# Patient Record
Sex: Male | Born: 1935 | Race: Asian | Hispanic: No | Marital: Married | Smoking: Former smoker
Health system: Southern US, Community
[De-identification: ages and names within clinical notes are randomized; demographics above are authoritative.]

## PROBLEM LIST (undated history)

## (undated) DIAGNOSIS — E039 Hypothyroidism, unspecified: Secondary | ICD-10-CM

## (undated) DIAGNOSIS — M1990 Unspecified osteoarthritis, unspecified site: Secondary | ICD-10-CM

## (undated) DIAGNOSIS — K56609 Unspecified intestinal obstruction, unspecified as to partial versus complete obstruction: Secondary | ICD-10-CM

## (undated) DIAGNOSIS — E119 Type 2 diabetes mellitus without complications: Secondary | ICD-10-CM

## (undated) DIAGNOSIS — M419 Scoliosis, unspecified: Secondary | ICD-10-CM

## (undated) DIAGNOSIS — C801 Malignant (primary) neoplasm, unspecified: Secondary | ICD-10-CM

## (undated) HISTORY — PX: FOREIGN BODY REMOVAL: SHX962

## (undated) HISTORY — PX: COLECTOMY: SHX59

## (undated) HISTORY — PX: CATARACT EXTRACTION W/ INTRAOCULAR LENS  IMPLANT, BILATERAL: SHX1307

## (undated) HISTORY — PX: LAPAROSCOPIC INCISIONAL / UMBILICAL / VENTRAL HERNIA REPAIR: SUR789

## (undated) HISTORY — PX: HERNIA REPAIR: SHX51

---

## 2008-10-21 ENCOUNTER — Observation Stay (HOSPITAL_COMMUNITY): Admission: EM | Admit: 2008-10-21 | Discharge: 2008-10-23 | Payer: Self-pay | Admitting: Emergency Medicine

## 2010-06-21 IMAGING — CR DG CHEST 1V PORT
1 series · 1 of 1 positions shown · non-contrast
Comparison: Portable exam 6660 hours without priors for comparison.

CLINICAL DATA: Chest pain

PORTABLE CHEST - 1 VIEW

[AP]
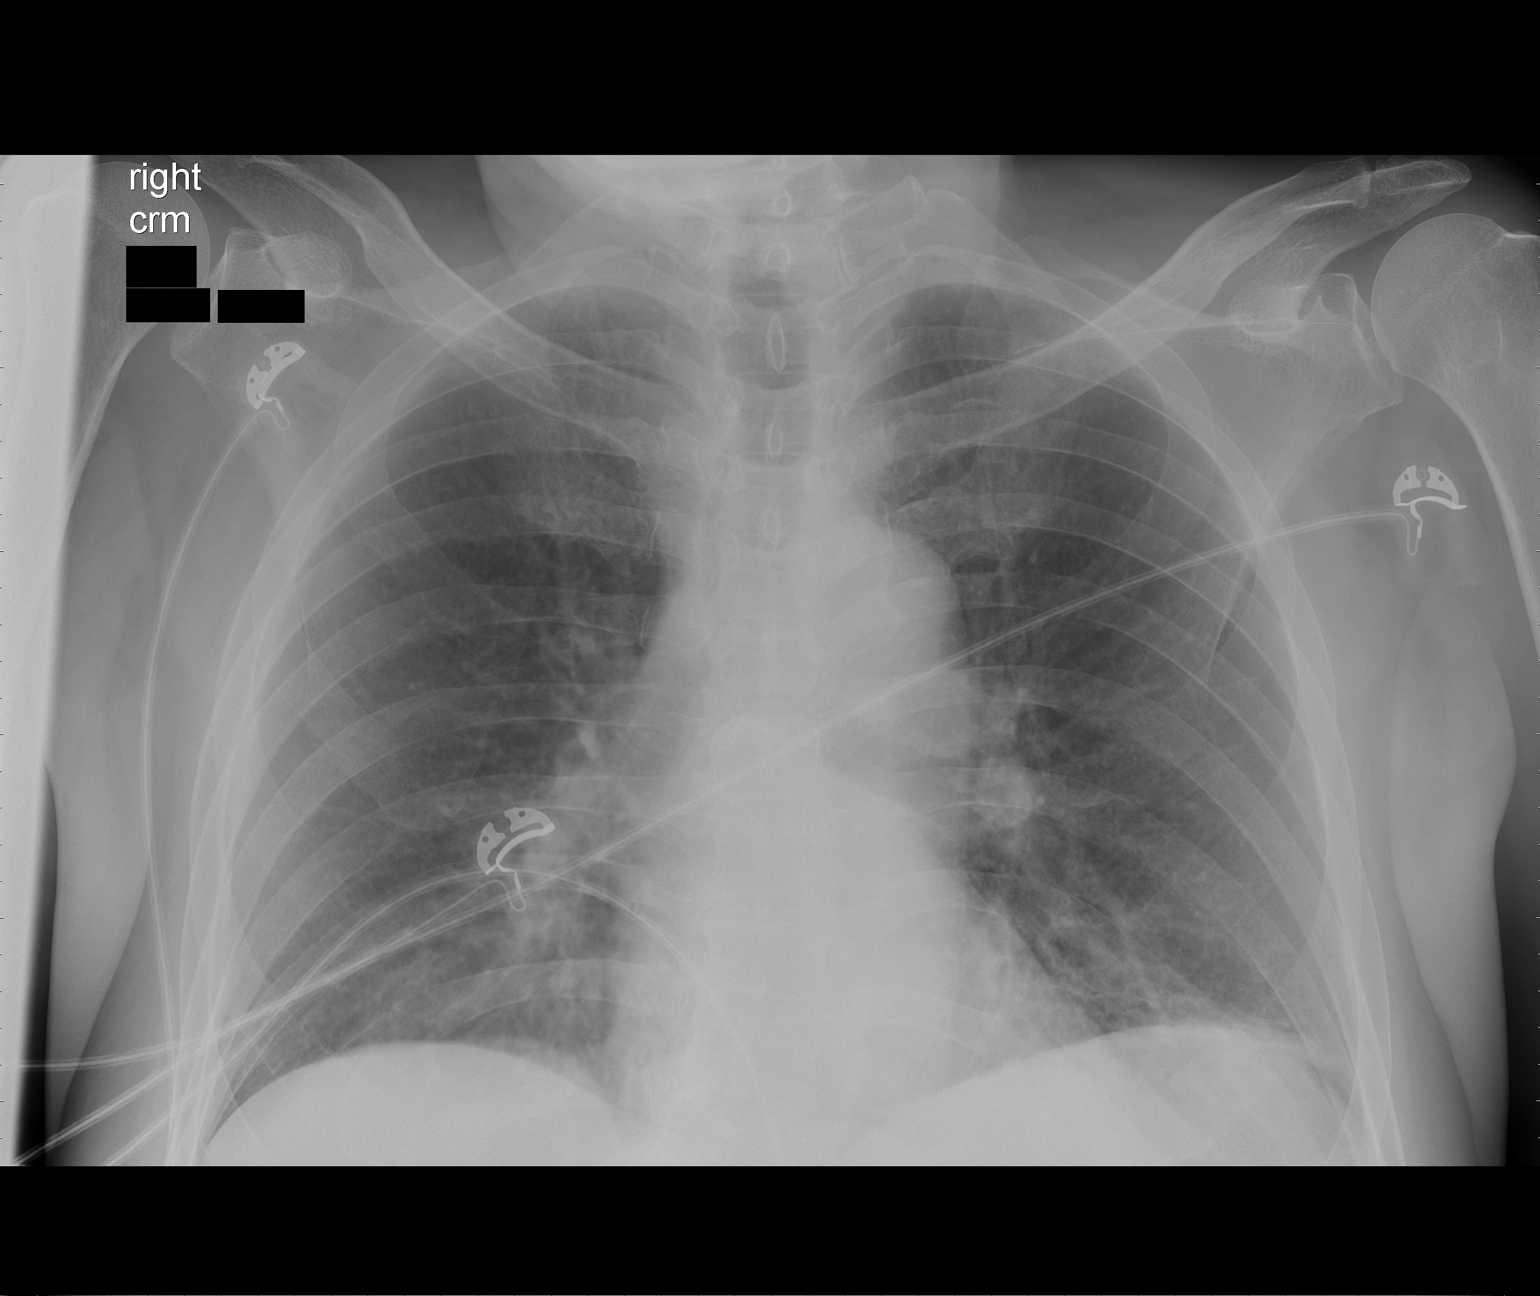

[1 of 1 positions shown; findings below may reference images not displayed]

FINDINGS: Normal heart size, mediastinal contours, and pulmonary vascularity.
Mild bibasilar atelectasis.
Lungs otherwise clear.
No pleural effusion or pneumothorax.
IMPRESSION: Bibasilar atelectasis.

## 2010-06-21 IMAGING — US US ABDOMEN COMPLETE
1 series · 14 of 25 positions shown · non-contrast
Comparison: None

CLINICAL DATA: Chest pain.

COMPLETE ABDOMINAL ULTRASOUND

[Series 1: us abdomen complete · 0.33mm/px · 14 of 67 slices shown]
[im 1/67]
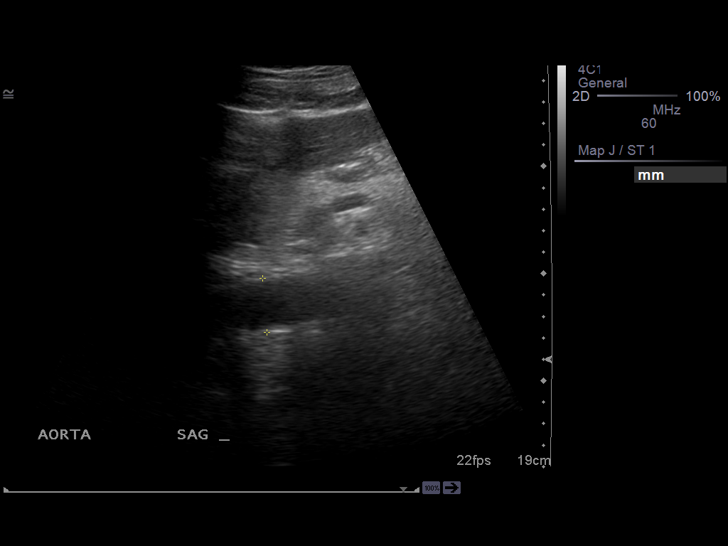
[im 6/67]
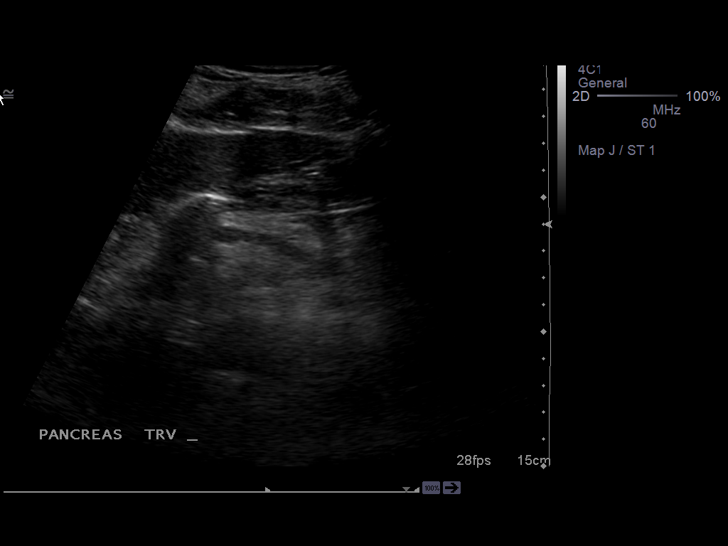
[im 12/67]
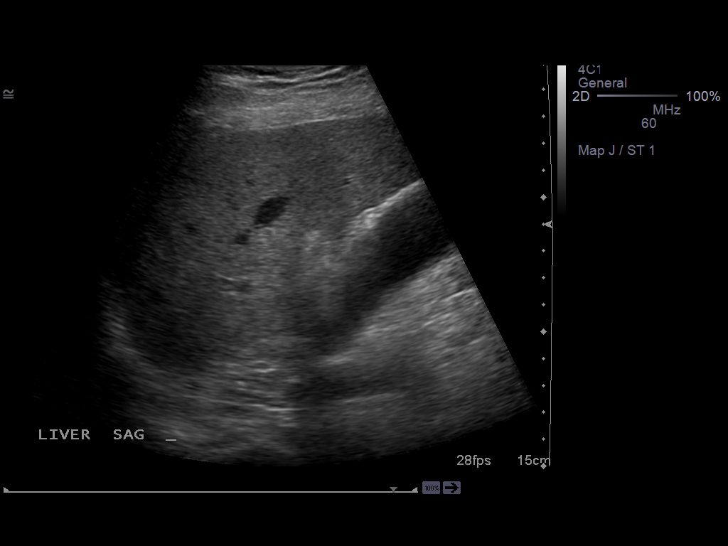
[im 17/67]
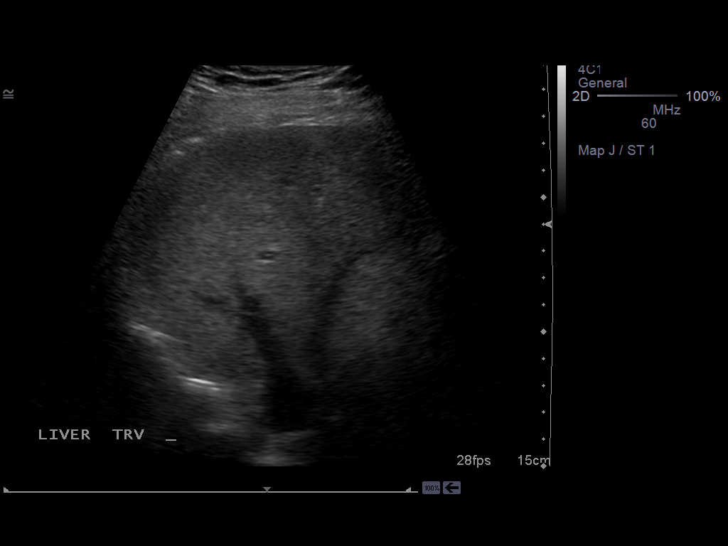
[im 23/67]
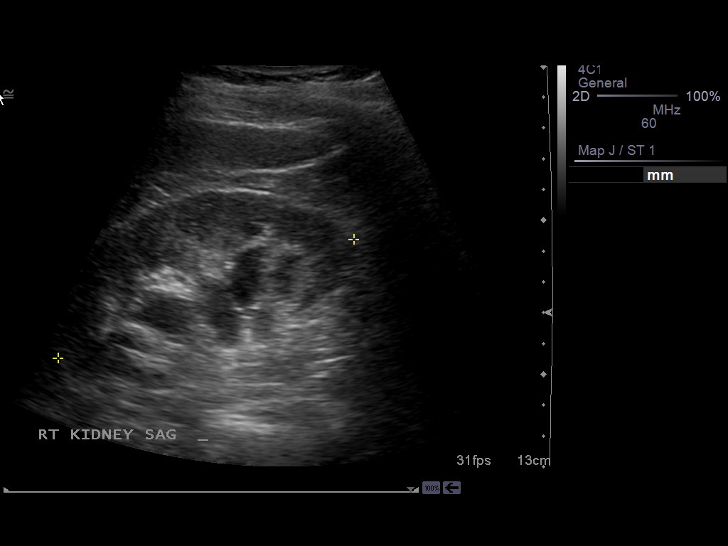
[im 25/67]
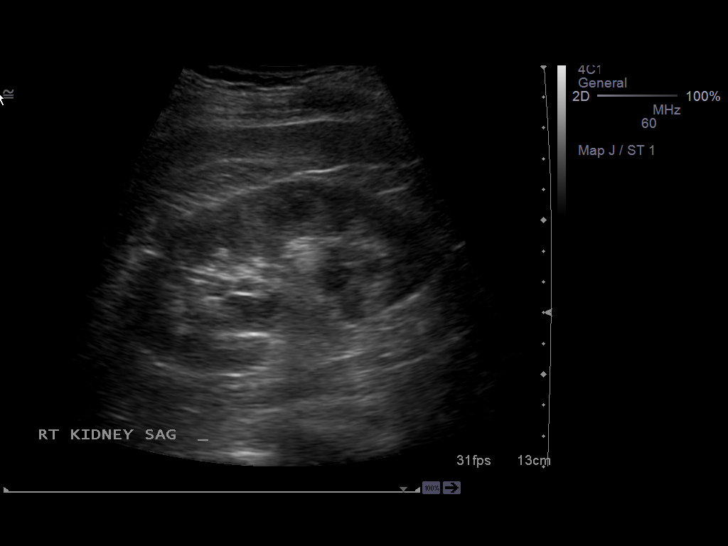
[im 31/67]
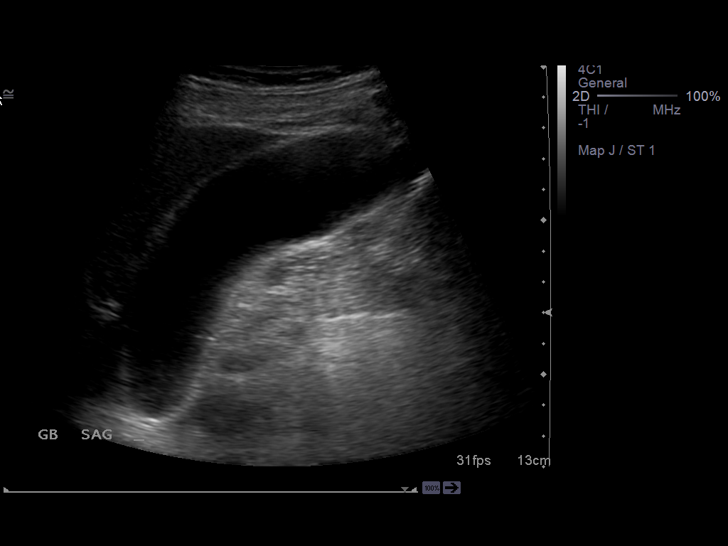
[im 36/67]
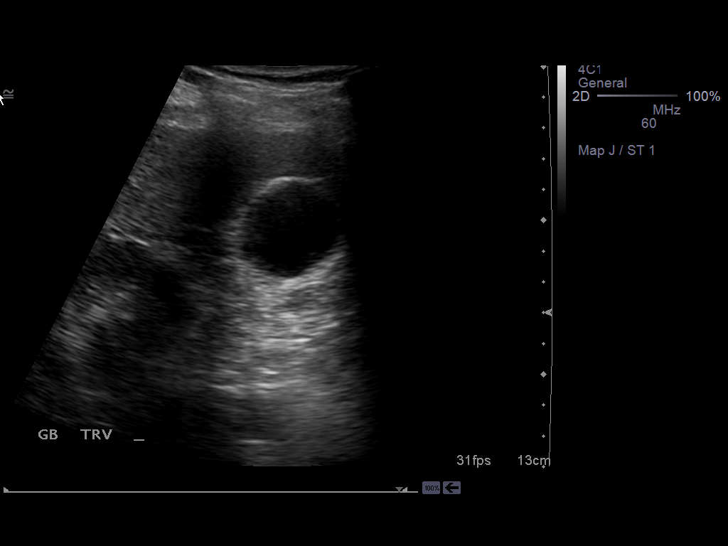
[im 42/67]
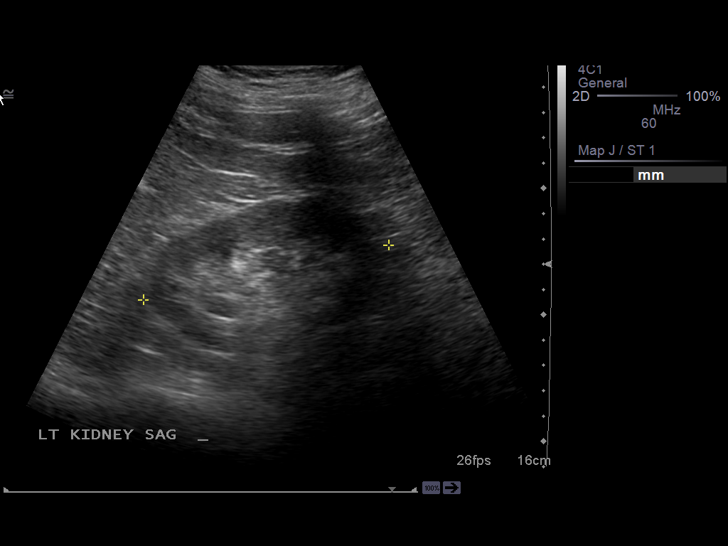
[im 45/67]
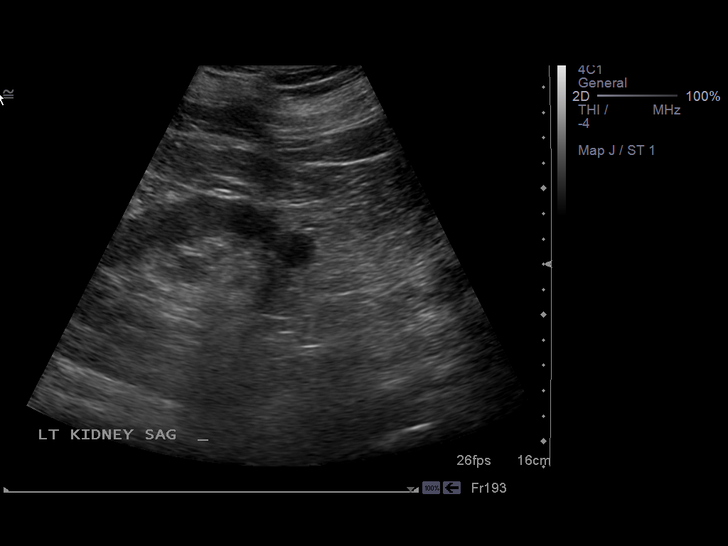
[im 50/67]
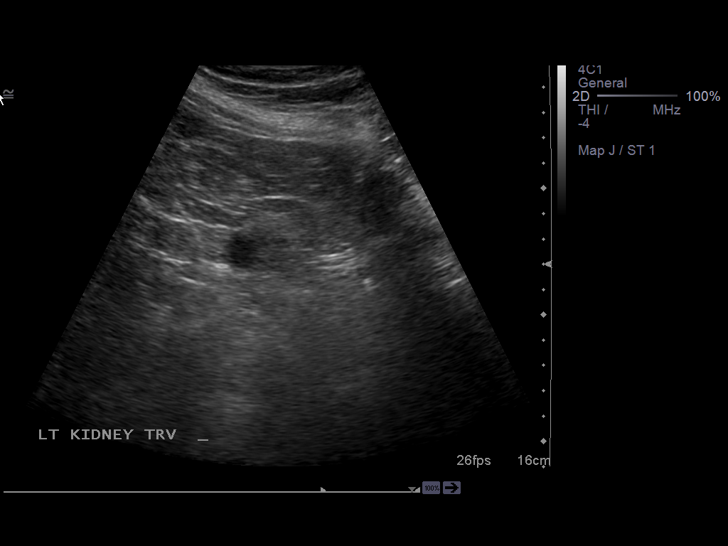
[im 56/67]
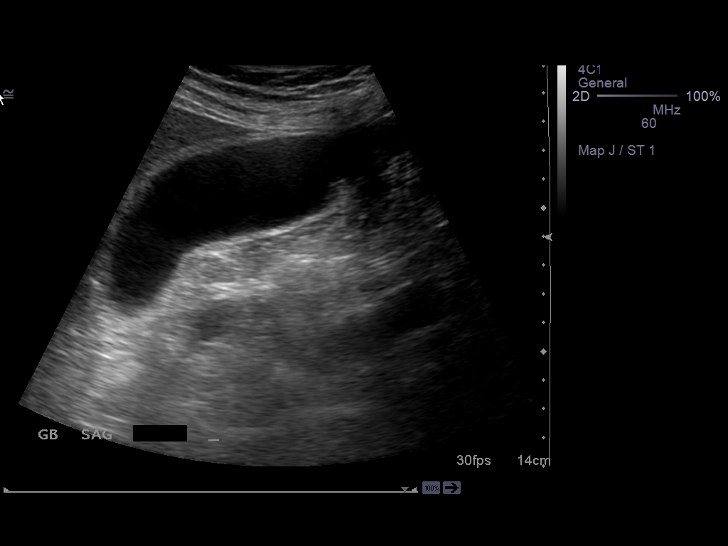
[im 61/67]
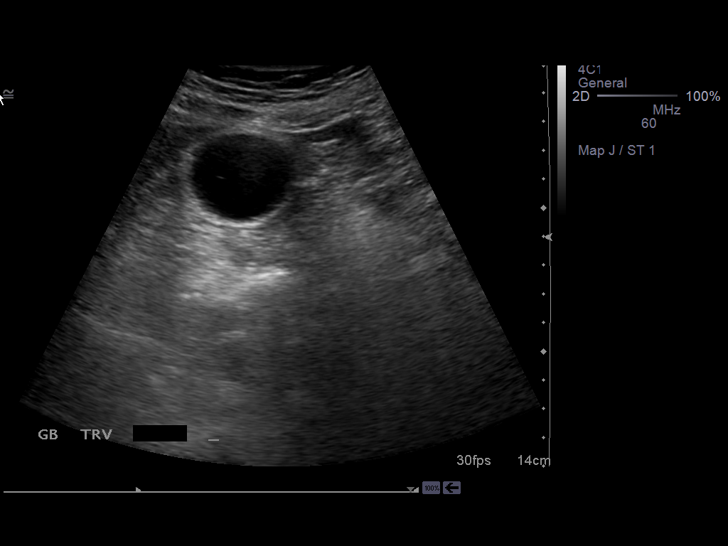
[im 67/67]
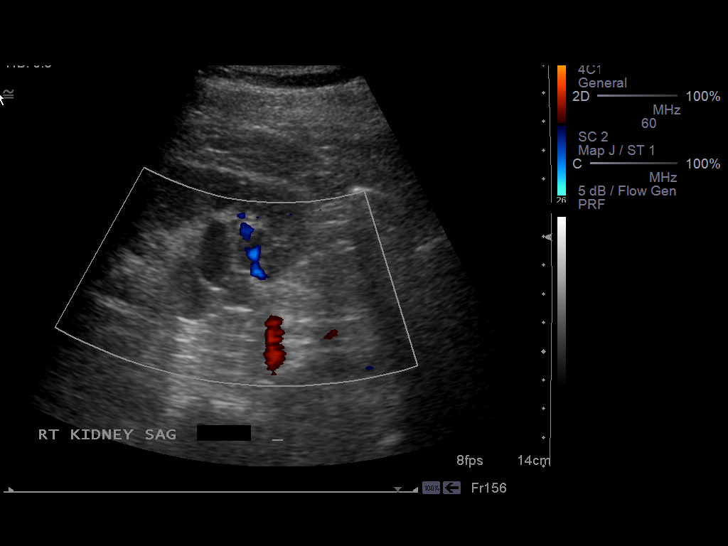

[14 of 25 positions shown; findings below may reference images not displayed]

FINDINGS: Gallbladder:  The gallbladder is distended and contains sludge but
there are no stones.

Common bile duct:  Normal at 4 mm maximal diameter.

Liver:  Normal.

IVC:  Normal.

Pancreas:  Normal.

Spleen:  Normal.  5.9 cm in length.

Right Kidney:  10.7 cm in length.  There are multiple parapelvic
cysts.  The appearance is not typical for hydronephrosis.

Left Kidney:  11.2 cm in length.  Simple 1.6 cm cyst on the lower
pole.

Abdominal aorta:  Maximum diameter of 2.5 cm.  The mid abdominal
aorta is obscured by bowel.
IMPRESSION: There is a small amount of sludge in the distended gallbladder.
Otherwise benign-appearing abdomen.  Parapelvic cysts in the right
kidney.

## 2010-06-30 LAB — GLUCOSE, CAPILLARY
Glucose-Capillary: 110 mg/dL — ABNORMAL HIGH (ref 70–99)
Glucose-Capillary: 82 mg/dL (ref 70–99)

## 2010-06-30 LAB — CBC
HCT: 37.3 % — ABNORMAL LOW (ref 39.0–52.0)
Hemoglobin: 12.3 g/dL — ABNORMAL LOW (ref 13.0–17.0)
MCHC: 33.1 g/dL (ref 30.0–36.0)
MCV: 83.9 fL (ref 78.0–100.0)
Platelets: 266 10*3/uL (ref 150–400)
RBC: 4.44 MIL/uL (ref 4.22–5.81)
RDW: 16.9 % — ABNORMAL HIGH (ref 11.5–15.5)
WBC: 7 10*3/uL (ref 4.0–10.5)

## 2010-06-30 LAB — HEPARIN LEVEL (UNFRACTIONATED)
Heparin Unfractionated: 0.56 IU/mL (ref 0.30–0.70)
Heparin Unfractionated: 0.71 IU/mL — ABNORMAL HIGH (ref 0.30–0.70)

## 2010-07-01 LAB — BASIC METABOLIC PANEL
BUN: 17 mg/dL (ref 6–23)
Calcium: 9.4 mg/dL (ref 8.4–10.5)
Creatinine, Ser: 0.83 mg/dL (ref 0.4–1.5)
GFR calc non Af Amer: >60 mL/min (ref >60)
Glucose, Bld: 171 mg/dL — ABNORMAL HIGH (ref 70–99)
Potassium: 3.6 mEq/L (ref 3.5–5.1)

## 2010-07-01 LAB — DIFFERENTIAL
Basophils Absolute: 0 10*3/uL (ref 0.0–0.1)
Eosinophils Relative: 0 % (ref 0–5)
Lymphocytes Relative: 14 % (ref 12–46)
Lymphs Abs: 1.4 10*3/uL (ref 0.7–4.0)
Neutrophils Relative %: 79 % — ABNORMAL HIGH (ref 43–77)

## 2010-07-01 LAB — COMPREHENSIVE METABOLIC PANEL
Alkaline Phosphatase: 52 U/L (ref 39–117)
BUN: 12 mg/dL (ref 6–23)
Glucose, Bld: 107 mg/dL — ABNORMAL HIGH (ref 70–99)
Potassium: 3.7 mEq/L (ref 3.5–5.1)
Total Protein: 6 g/dL (ref 6.0–8.3)

## 2010-07-01 LAB — HEPATIC FUNCTION PANEL
Albumin: 3.4 g/dL — ABNORMAL LOW (ref 3.5–5.2)
Bilirubin, Direct: <0.1 mg/dL (ref 0.0–0.3)
Total Bilirubin: 0.6 mg/dL (ref 0.3–1.2)

## 2010-07-01 LAB — GLUCOSE, CAPILLARY
Glucose-Capillary: 102 mg/dL — ABNORMAL HIGH (ref 70–99)
Glucose-Capillary: 119 mg/dL — ABNORMAL HIGH (ref 70–99)
Glucose-Capillary: 132 mg/dL — ABNORMAL HIGH (ref 70–99)
Glucose-Capillary: 94 mg/dL (ref 70–99)

## 2010-07-01 LAB — CARDIAC PANEL(CRET KIN+CKTOT+MB+TROPI)
CK, MB: 3.9 ng/mL (ref 0.3–4.0)
Relative Index: 2.5 (ref 0.0–2.5)
Total CK: 159 U/L (ref 7–232)
Troponin I: 0.01 ng/mL (ref 0.00–0.06)

## 2010-07-01 LAB — CBC
HCT: 33.8 % — ABNORMAL LOW (ref 39.0–52.0)
HCT: 39.7 % (ref 39.0–52.0)
Hemoglobin: 11.3 g/dL — ABNORMAL LOW (ref 13.0–17.0)
MCHC: 33.3 g/dL (ref 30.0–36.0)
Platelets: 316 10*3/uL (ref 150–400)
RDW: 16.6 % — ABNORMAL HIGH (ref 11.5–15.5)
RDW: 16.8 % — ABNORMAL HIGH (ref 11.5–15.5)
WBC: 10.4 10*3/uL (ref 4.0–10.5)

## 2010-07-01 LAB — LIPASE, BLOOD
Lipase: 12 U/L (ref 11–59)
Lipase: 15 U/L (ref 11–59)

## 2010-07-01 LAB — D-DIMER, QUANTITATIVE: D-Dimer, Quant: 0.36 ug/mL-FEU (ref 0.00–0.48)

## 2010-07-01 LAB — TROPONIN I: Troponin I: 0.01 ng/mL (ref 0.00–0.06)

## 2010-07-01 LAB — POCT CARDIAC MARKERS
CKMB, poc: 1.4 ng/mL (ref 1.0–8.0)
Myoglobin, poc: 149 ng/mL (ref 12–200)
Myoglobin, poc: 94.5 ng/mL (ref 12–200)

## 2010-07-01 LAB — HEMOGLOBIN A1C
Hgb A1c MFr Bld: 5.7 % (ref 4.6–6.1)
Mean Plasma Glucose: 117 mg/dL

## 2010-07-01 LAB — AMYLASE: Amylase: 90 U/L (ref 27–131)

## 2010-07-01 LAB — BRAIN NATRIURETIC PEPTIDE: Pro B Natriuretic peptide (BNP): <30 pg/mL (ref 0.0–100.0)

## 2010-07-01 LAB — T4, FREE: Free T4: 0.87 ng/dL (ref 0.80–1.80)

## 2010-08-07 NOTE — H&P (Signed)
NAMEEDVIN, ALBUS NO.:  [id number]   MEDICAL RECORD NO.:  [id number]          PATIENT TYPE:  EMS   LOCATION:  MAJO                         FACILITY:  [facility]   PHYSICIAN:  Elliot Cousin, M.D.    DATE OF BIRTH:  26-Sep-1933   DATE OF ADMISSION:  10/21/2008  DATE OF DISCHARGE:                              HISTORY & PHYSICAL   PRIMARY CARE PHYSICIAN:  The patient is unassigned.   CHIEF COMPLAINT:  Chest pain.   HISTORY OF PRESENT ILLNESS:  The patient is a 77 year old Latino man  with a past medical history significant for 3 abdominal surgeries,  osteoporosis, and degenerative joint disease.  He is from [place],  and he is visiting his daughter and son-in-law here in [place], [place].  At approximately midnight he developed right-sided chest pain  that radiated to the substernal area.  At the time he was attempting to  go to sleep.  He was unable to sleep as his pain became more and more  intense.  He describes the pain as a sharp and pressure-like pain.  He  did have 1 episode of nausea and vomiting.  He was a little short of  breath and lightheaded.  He denies any associated blackouts, headaches,  pleurisy, cough, abdominal pain, pain with urination, or pain and  swelling in his legs.  He denies any history of heart disease,  hypertension, and diabetes mellitus.  He does not recall having any  history of indigestion or heartburn.  He does not take an aspirin a day.  He does not smoke and he does not drink alcohol.   During the evaluation in the emergency department, he was noted to be  hemodynamically stable.  His EKG revealed normal sinus rhythm with a  heart rate of 78 beats per minute and no ST or T-wave elevations or  depressions.  His blood pressure was 107/61, and his heart rate was 81.  He was given a GI cocktail and a sublingual nitroglycerin which did ease  his pain.  Now he says that he is pain free.  He is being admitted for  further  evaluation and management.   PAST MEDICAL HISTORY:  1. Status post 3 abdominal surgeries; possibly secondary to either a      small bowel obstruction or a large bowel obstruction or both.  He      is also status post colostomy with a reversal later.  2. Osteoporosis.  3. Degenerative joint disease.   MEDICATIONS:  1. Medication for osteoporosis for which he takes 1 pill every Monday      (possibly Fosamax)  2. Multivitamin once daily.  3. Calcium with vitamin D 1 tablet daily.  4. Omega-3 supplement once daily.   ALLERGIES:  No known drug allergies.   Dictation ended at this point.      Elliot Cousin, M.D.  Electronically Signed     DF/MEDQ  D:  10/21/2008  T:  10/21/2008  Job:  [id number]

## 2010-08-07 NOTE — Consult Note (Signed)
NAMESELWYN, REASON NO.:  [id number]   MEDICAL RECORD NO.:  [id number]          PATIENT TYPE:  OBV   LOCATION:  4703                         FACILITY:  [facility]   PHYSICIAN:  Armanda Magic, M.D.     DATE OF BIRTH:  22-Aug-1933   DATE OF CONSULTATION:  10/21/2008  DATE OF DISCHARGE:                                 CONSULTATION   REFERRING PHYSICIAN:  Hospitalist.   CHIEF COMPLAINT:  Chest pain.   HISTORY OF PRESENT ILLNESS:  This is a 77 year old white male with no  prior cardiac history who presented to the emergency room with history  of epigastric pain.  He has never had this before, but at this time  around 1 o'clock he awoke with right-sided chest pain with radiation to  the epigastric area.  It last about 1 hour, but was not associated with  nausea, vomiting, or diaphoresis.  He says he might have been mildly  short of breath.  He is currently pain free.  There was no other  radiation of the chest pain noted at the time it was occurring.   PAST MEDICAL HISTORY:  Osteoporosis.   PAST SURGICAL HISTORY:  Bowel resection with colostomy.   SOCIAL HISTORY:  No IV drug abuse or alcohol.  There is a questionable  history of tobacco use in the past.   MEDICATIONS:  Vitamins.   ALLERGIES:  He has no known drug allergies.   FAMILY HISTORY:  There is no history of CAD.   PHYSICAL EXAMINATION:  VITAL SIGNS:  Blood pressure is 107/61, heart  rate is 81.  GENERAL:  This a well-developed and well-nourished male, in no acute  distress.  HEENT:  Benign.  NECK:  Supple without lymphadenopathy.  Carotid upstrokes +2  bilaterally, no bruits.  LUNGS:  Clear to auscultation throughout.  HEART:  Regular rate and rhythm.  No murmurs, rubs, or gallops.  Normal  S and S2.  ABDOMEN:  Soft, but tenderness is evident to palpation of the right  epigastric and right upper quadrant area.  There are no pulsatile  masses.  EXTREMITIES:  No cyanosis, erythema, or edema.  Dorsalis  pedis pulses +2  bilaterally.   LABORATORIES:  Troponin 0.05 x2.  CPK-MB 1.4 and 1.6, myoglobin 149 and  94.  BNP less than 30, and troponin 0.01.  Sodium 137, potassium 3.6,  chloride 105, bicarb 24, BUN 17, and creatinine 0.83.  White cell count  10.4, hemoglobin of 13.3, hematocrit 39.7, and platelet count 316.  Chest x-ray shows bibasilar atelectasis.  EKG shows normal sinus rhythm.  No ST changes.   ASSESSMENT:  1. Atypical chest pain with cardiac enzymes negative x2 and      nonischemic EKG.  He really has no significant cardiac risk factors      except for male sex and his age.  2. Abdominal pain in the right upper quadrant with history of multiple      abdominal surgeries.   PLAN:  Rule out MI with serial cardiac enzymes.  Stress Cardiolite in  the morning if enzymes are negative, consider  abdominal ultrasound to  rule out cholelithiasis given his tenderness and we will also check  LFTs, amylase and lipase.      Armanda Magic, M.D.  Electronically Signed     TT/MEDQ  D:  10/21/2008  T:  10/21/2008  Job:  [id number]

## 2010-08-07 NOTE — Discharge Summary (Signed)
NAMETANAY, MISURACA NO.:  [id number]   MEDICAL RECORD NO.:  [id number]          PATIENT TYPE:  OBV   LOCATION:  4703                         FACILITY:  [facility]   PHYSICIAN:  Theodosia Paling, MD    DATE OF BIRTH:  January 04, 1934   DATE OF ADMISSION:  10/21/2008  DATE OF DISCHARGE:  10/23/2008                               DISCHARGE SUMMARY   PRIMARY CARE PHYSICIAN:  The patient follows up Dr. Archer Asa,  [place], [place], [place].   ADMITTING HISTORY:  Please refer to the excellent admission note  dictated by Dr. Elliot Cousin under history of present illness.   DISCHARGE DIAGNOSIS:  Chest pain, most likely noncardiac in origin with  negative stress test and negative cardiac enzymes, normal EKG.   SECONDARY DIAGNOSES:  1. History of osteoporosis.  2. History of degenerative joint disease.   DISCHARGE MEDICATIONS:  Discharge medications are as follows; new  medications added;  1. Aspirin 81 mg enteric coated p.o. daily for 1 month.  2. Omeprazole 20 mg p.o. q.12 h. for 1 month.  3. Maalox 30 mL p.o. q.6 h. p.r.n. for 1 week.   Home medications to be continued:  1. Fosamax 70 mg p.o. every Monday.  2. Multivitamin 1 tab p.o. daily.  3. Calcium, vitamin D 1 tab p.o. daily.  4. Omega 3 supplements 1 capsule p.o. daily.   HOSPITAL COURSE:  Following issues were addressed during the  hospitalizations.  1. Chest pain.  The patient was admitted to the hospital for chest      pain evaluation, 3 sets of cardiac enzymes were performed which are      negative.  Tele evaluation was normal.  He underwent stress test      evaluation which was negative as well.  His EKG was normal.      Cardiology consultation was carried out on October 21, 2008, by Dr.      Armanda Magic.  Dr. Norris Cross impression is noncardiac pain given      negative stress test and negative enzymes.  She has cleared him to      go home.  The patient will followup with the primary care  physician      in 1 week time for further management.  2. Osteoporosis has remained stable.   DISPOSITION:  The patient is going to followup with Dr. Archer Asa in  1 week time.   Total time spent in discharge of this patient 45 minutes.   PROCEDURE PERFORMED:  None.   IMAGING PERFORMED:  As mentioned under hospital course.  In addition  ultrasound of the abdomen showed small sludge and distended gallbladder  otherwise benign-appearing abdomen.  Parapelvic cyst in right kidney.  Chest x-ray done on October 21, 2008, showing bibasilar atelectasis.      Theodosia Paling, MD  Electronically Signed     NP/MEDQ  D:  10/23/2008  T:  10/23/2008  Job:  [id number]   cc:   Archer Asa, MD

## 2010-08-07 NOTE — H&P (Signed)
NAMEIOAN, LANDINI NO.:  [id number]   MEDICAL RECORD NO.:  [id number]          PATIENT TYPE:  OBV   LOCATION:  4703                         FACILITY:  [facility]   PHYSICIAN:  Elliot Cousin, M.D.    DATE OF BIRTH:  Oct 22, 1933   DATE OF ADMISSION:  10/21/2008  DATE OF DISCHARGE:                              HISTORY & PHYSICAL   CONTINUATION   SOCIAL HISTORY:  The patient lives in [place].  He is visiting his  daughter and son-in-law in [place].  He is married.  He has two children in all.  He is retired.  He denies tobacco, alcohol  and illicit drug use.   FAMILY HISTORY:  His father died at 22 years of age, etiology unknown.  He had no history of heart disease.  His mother died of old age at 39-  years-old.   REVIEW OF SYSTEMS:  Otherwise negative.   PHYSICAL EXAMINATION:  Temperature 98.1, blood pressure 140/80, pulse  83, respiratory rate 17, oxygen saturation 99% on room air.  GENERAL:  The patient is a pleasant 77 year old Latino man who is  currently lying in bed in no acute distress.  HEENT:  Head is normocephalic nontraumatic.  Pupils equal, round,  reactive to light.  Extraocular muscles are intact.  Conjunctivae are  clear.  Sclerae are white.  Tympanic membranes not examined.  Nasal  mucosa is moist.  Oropharynx reveals a full set of dentures.  Mucous  membranes are moist.  No posterior exudates or erythema.  NECK:  Supple.  No adenopathy, no thyromegaly, no bruit, no JVD.  LUNGS:  Clear to auscultation bilaterally.  HEART:  S1-S2 with no murmurs, rubs or gallops.  ABDOMEN:  Mildly obese, positive bowel sounds.  Well-healed vertical  abdominal scar.  Mildly tender in the epigastrium without guarding,  distention, or masses palpated.  No hepatosplenomegaly.  GU/ RECTAL:  Deferred.  EXTREMITIES:  Pedal pulses are palpable bilaterally.  No pedal edema and  no pretibial edema.  NEUROLOGIC:  The patient is alert and oriented x3.   Cranial nerves II-  XII are intact.  Strength is 5/5 throughout.  Sensation is intact.   ADMISSION LABORATORIES:  EKG reveals normal sinus rhythm with a heart  rate of 78 beats per minute and no ST or T-wave abnormalities.  Chest x-  ray reveals bibasilar atelectasis.  WBC 10.1, hemoglobin 13.3, platelet  count 316, CK-MB 1.6, troponin I less than 0.05, myoglobin 94.5.  Sodium  137, potassium 3.6, chloride 105, CO2 24, glucose 171, BUN 17,  creatinine 0.83, calcium 9.4.   ASSESSMENT:  1. Chest pain.  The patient's chest pain was relieved with sublingual      nitroglycerin and a GI cocktail.  His cardiac risk factors include      age and gender.  He has no history of diabetes mellitus,      hypertension, or heart disease.  His venous glucose however is      elevated but again he provides no known history of diabetes      mellitus.  He also has mild  epigastric abdominal pain.  Given his      history of nausea and vomiting once with the onset of chest pain,      certainly a GI etiology is a consideration.  His EKG is nonacute.      His initial cardiac markers are negative.  2. History of multiple abdominal surgeries.  On exam, there is no      evidence of rigidity, guarding or distention.   PLAN:  1. The patient will be admitted for further evaluation and management.  2. For further assessment, will check cardiac enzymes, TSH, T4, D-      dimer, and fasting lipid panel.  We will also assess for a GI      etiology by checking an amylase, lipase, and liver transaminases.  3. Will check a hemoglobin A1c and monitor his capillary blood glucose      twice daily.  4. Will start a small dose of metoprolol b.i.d., aspirin, prophylactic      Lovenox, and prophylactic Protonix.  Will continue nitropaste      however at a lower dose.  Will also start as-needed Mylanta.  5. We will consult cardiology.  6. Will check a follow-up EKG in the morning.      Elliot Cousin, M.D.  Electronically  Signed     DF/MEDQ  D:  10/21/2008  T:  10/21/2008  Job:  [id number]

## 2014-03-25 HISTORY — PX: LAPAROSCOPIC DISTAL PANCREATECTOMY: SHX1922

## 2017-01-17 DIAGNOSIS — C259 Malignant neoplasm of pancreas, unspecified: Secondary | ICD-10-CM | POA: Diagnosis present

## 2017-07-23 DIAGNOSIS — E039 Hypothyroidism, unspecified: Secondary | ICD-10-CM | POA: Diagnosis present

## 2018-03-04 ENCOUNTER — Other Ambulatory Visit: Payer: Self-pay

## 2018-03-04 ENCOUNTER — Encounter (HOSPITAL_COMMUNITY): Payer: Self-pay

## 2018-03-04 ENCOUNTER — Emergency Department (HOSPITAL_COMMUNITY): Payer: Medicare Other

## 2018-03-04 ENCOUNTER — Inpatient Hospital Stay (HOSPITAL_COMMUNITY)
Admission: EM | Admit: 2018-03-04 | Discharge: 2018-03-07 | DRG: 389 | Disposition: A | Discharge status: Home / Self Care | Payer: Medicare Other | Attending: Internal Medicine | Admitting: Internal Medicine

## 2018-03-04 ENCOUNTER — Inpatient Hospital Stay (HOSPITAL_COMMUNITY): Payer: Medicare Other

## 2018-03-04 DIAGNOSIS — Z79899 Other long term (current) drug therapy: Secondary | ICD-10-CM | POA: Diagnosis not present

## 2018-03-04 DIAGNOSIS — C786 Secondary malignant neoplasm of retroperitoneum and peritoneum: Secondary | ICD-10-CM | POA: Diagnosis present

## 2018-03-04 DIAGNOSIS — Z7984 Long term (current) use of oral hypoglycemic drugs: Secondary | ICD-10-CM | POA: Diagnosis not present

## 2018-03-04 DIAGNOSIS — F17210 Nicotine dependence, cigarettes, uncomplicated: Secondary | ICD-10-CM | POA: Diagnosis present

## 2018-03-04 DIAGNOSIS — E785 Hyperlipidemia, unspecified: Secondary | ICD-10-CM | POA: Diagnosis present

## 2018-03-04 DIAGNOSIS — E119 Type 2 diabetes mellitus without complications: Secondary | ICD-10-CM | POA: Diagnosis present

## 2018-03-04 DIAGNOSIS — E039 Hypothyroidism, unspecified: Secondary | ICD-10-CM | POA: Diagnosis present

## 2018-03-04 DIAGNOSIS — Z681 Body mass index (BMI) 19 or less, adult: Secondary | ICD-10-CM | POA: Diagnosis not present

## 2018-03-04 DIAGNOSIS — E440 Moderate protein-calorie malnutrition: Secondary | ICD-10-CM | POA: Diagnosis present

## 2018-03-04 DIAGNOSIS — K565 Intestinal adhesions [bands], unspecified as to partial versus complete obstruction: Principal | ICD-10-CM | POA: Diagnosis present

## 2018-03-04 DIAGNOSIS — Z90411 Acquired partial absence of pancreas: Secondary | ICD-10-CM | POA: Diagnosis not present

## 2018-03-04 DIAGNOSIS — K56609 Unspecified intestinal obstruction, unspecified as to partial versus complete obstruction: Secondary | ICD-10-CM | POA: Diagnosis not present

## 2018-03-04 DIAGNOSIS — C259 Malignant neoplasm of pancreas, unspecified: Secondary | ICD-10-CM | POA: Diagnosis present

## 2018-03-04 HISTORY — DX: Scoliosis, unspecified: M41.9

## 2018-03-04 HISTORY — DX: Type 2 diabetes mellitus without complications: E11.9

## 2018-03-04 HISTORY — DX: Malignant (primary) neoplasm, unspecified: C80.1

## 2018-03-04 HISTORY — DX: Hypothyroidism, unspecified: E03.9

## 2018-03-04 HISTORY — DX: Unspecified osteoarthritis, unspecified site: M19.90

## 2018-03-04 HISTORY — DX: Unspecified intestinal obstruction, unspecified as to partial versus complete obstruction: K56.609

## 2018-03-04 LAB — CBC WITH DIFFERENTIAL/PLATELET
Abs Immature Granulocytes: 0 10*3/uL (ref 0.00–0.07)
Basophils Absolute: 0 10*3/uL (ref 0.0–0.1)
Basophils Relative: 0 %
Eosinophils Absolute: 0 10*3/uL (ref 0.0–0.5)
Eosinophils Relative: 0 %
HCT: 32.2 % — ABNORMAL LOW (ref 39.0–52.0)
Hemoglobin: 9.9 g/dL — ABNORMAL LOW (ref 13.0–17.0)
Lymphocytes Relative: 7 %
Lymphs Abs: 0.8 10*3/uL (ref 0.7–4.0)
MCH: 26.3 pg (ref 26.0–34.0)
MCHC: 30.7 g/dL (ref 30.0–36.0)
MCV: 85.6 fL (ref 80.0–100.0)
Monocytes Absolute: 1.1 10*3/uL — ABNORMAL HIGH (ref 0.1–1.0)
Monocytes Relative: 9 %
Neutro Abs: 9.9 10*3/uL — ABNORMAL HIGH (ref 1.7–7.7)
Neutrophils Relative %: 84 %
Platelets: 448 10*3/uL — ABNORMAL HIGH (ref 150–400)
RBC: 3.76 MIL/uL — ABNORMAL LOW (ref 4.22–5.81)
RDW: 21.2 % — ABNORMAL HIGH (ref 11.5–15.5)
WBC: 11.8 10*3/uL — ABNORMAL HIGH (ref 4.0–10.5)
nRBC: 0 % (ref 0.0–0.2)
nRBC: 0 /100{WBCs}

## 2018-03-04 LAB — URINALYSIS, ROUTINE W REFLEX MICROSCOPIC
Bilirubin Urine: NEGATIVE
Glucose, UA: NEGATIVE mg/dL
HGB URINE DIPSTICK: NEGATIVE
KETONES UR: NEGATIVE mg/dL
Leukocytes, UA: NEGATIVE
Nitrite: NEGATIVE
Protein, ur: NEGATIVE mg/dL
Specific Gravity, Urine: 1.032 — ABNORMAL HIGH (ref 1.005–1.030)
pH: 7 (ref 5.0–8.0)

## 2018-03-04 LAB — COMPREHENSIVE METABOLIC PANEL WITH GFR
ALT: 39 U/L (ref 0–44)
AST: 41 U/L (ref 15–41)
Albumin: 3 g/dL — ABNORMAL LOW (ref 3.5–5.0)
Alkaline Phosphatase: 102 U/L (ref 38–126)
Anion gap: 10 (ref 5–15)
BUN: 13 mg/dL (ref 8–23)
CO2: 23 mmol/L (ref 22–32)
Calcium: 9 mg/dL (ref 8.9–10.3)
Chloride: 101 mmol/L (ref 98–111)
Creatinine, Ser: 0.68 mg/dL (ref 0.61–1.24)
GFR calc Af Amer: >60 mL/min
GFR calc non Af Amer: >60 mL/min
Glucose, Bld: 148 mg/dL — ABNORMAL HIGH (ref 70–99)
Potassium: 3.8 mmol/L (ref 3.5–5.1)
Sodium: 134 mmol/L — ABNORMAL LOW (ref 135–145)
Total Bilirubin: 0.9 mg/dL (ref 0.3–1.2)
Total Protein: 6.5 g/dL (ref 6.5–8.1)

## 2018-03-04 LAB — I-STAT TROPONIN, ED: Troponin i, poc: 0 ng/mL (ref 0.00–0.08)

## 2018-03-04 LAB — LIPASE, BLOOD: Lipase: 21 U/L (ref 11–51)

## 2018-03-04 MED ORDER — AZELASTINE HCL 0.1 % NA SOLN
2.0000 | NASAL | Status: DC | PRN
  Filled 2018-03-04: qty 30

## 2018-03-04 MED ORDER — LIDOCAINE HCL URETHRAL/MUCOSAL 2 % EX GEL
1.0000 "application " | Freq: Once | CUTANEOUS | Status: AC
  Administered 2018-03-04: 1
  Filled 2018-03-04: qty 20

## 2018-03-04 MED ORDER — LEVOTHYROXINE SODIUM 50 MCG PO TABS: 50.0000 ug | ORAL_TABLET | Freq: Every day | ORAL | Status: DC

## 2018-03-04 MED ORDER — ONDANSETRON HCL 4 MG/2ML IJ SOLN: 4.0000 mg | Freq: Four times a day (QID) | INTRAMUSCULAR | Status: DC | PRN

## 2018-03-04 MED ORDER — LEVOTHYROXINE SODIUM 100 MCG IV SOLR
25.0000 ug | Freq: Every day | INTRAVENOUS | Status: DC
  Administered 2018-03-04 – 2018-03-07 (×4): 25 ug via INTRAVENOUS
  Filled 2018-03-04 (×4): qty 5

## 2018-03-04 MED ORDER — DOCUSATE SODIUM 100 MG PO CAPS: 100.0000 mg | ORAL_CAPSULE | Freq: Two times a day (BID) | ORAL | Status: DC | PRN

## 2018-03-04 MED ORDER — SODIUM CHLORIDE 0.9 % IV BOLUS
500.0000 mL | Freq: Once | INTRAVENOUS | Status: AC
  Administered 2018-03-04: 500 mL via INTRAVENOUS

## 2018-03-04 MED ORDER — HYDROCODONE-ACETAMINOPHEN 5-325 MG PO TABS
1.0000 | ORAL_TABLET | Freq: Four times a day (QID) | ORAL | Status: DC | PRN
  Administered 2018-03-05 – 2018-03-07 (×4): 1 via ORAL
  Filled 2018-03-04 (×4): qty 1

## 2018-03-04 MED ORDER — IOHEXOL 300 MG/ML  SOLN
100.0000 mL | Freq: Once | INTRAMUSCULAR | Status: AC | PRN
  Administered 2018-03-04: 100 mL via INTRAVENOUS

## 2018-03-04 MED ORDER — ACETAMINOPHEN 500 MG PO TABS: 500.0000 mg | ORAL_TABLET | Freq: Three times a day (TID) | ORAL | Status: DC | PRN

## 2018-03-04 MED ORDER — BISACODYL 10 MG RE SUPP: 10.0000 mg | Freq: Every day | RECTAL | Status: DC | PRN

## 2018-03-04 MED ORDER — SODIUM CHLORIDE 0.9 % IV SOLN
INTRAVENOUS | Status: DC
  Administered 2018-03-04 – 2018-03-06 (×8): via INTRAVENOUS

## 2018-03-04 MED ORDER — KETOROLAC TROMETHAMINE 15 MG/ML IJ SOLN
15.0000 mg | Freq: Four times a day (QID) | INTRAMUSCULAR | Status: DC | PRN
  Administered 2018-03-04 – 2018-03-05 (×4): 15 mg via INTRAVENOUS
  Filled 2018-03-04 (×3): qty 1

## 2018-03-04 MED ORDER — ONDANSETRON HCL 4 MG PO TABS: 4.0000 mg | ORAL_TABLET | Freq: Four times a day (QID) | ORAL | Status: DC | PRN

## 2018-03-04 MED ORDER — ENOXAPARIN SODIUM 30 MG/0.3ML ~~LOC~~ SOLN
30.0000 mg | SUBCUTANEOUS | Status: DC
  Administered 2018-03-04 – 2018-03-06 (×3): 30 mg via SUBCUTANEOUS
  Filled 2018-03-04 (×3): qty 0.3

## 2018-03-04 MED ORDER — KETOROLAC TROMETHAMINE 15 MG/ML IJ SOLN
INTRAMUSCULAR | Status: AC
  Filled 2018-03-04: qty 1

## 2018-03-04 NOTE — H&P (Signed)
History and Physical    Tony Austin OAC:[id number] DOB: Feb 28, 1934 DOA: 03/04/2018  PCP: System, Pcp Not In Consultants:  Gen surgery Patient coming from: daughter's house- lives with wife in [place]  Chief Complaint: Abd pain, vomiting  HPI: Tony Austin is a 84 y.o. male with medical history significant for pancreatic cancer on chemo, hypothyroidism, HLD who presented to the ED today with c/o abdominal pain and vomiting. The abdominal pain has been waxing and waning, started just after Thanksgiving. Last night he took a tylenol/hydrocodone (which he rarely takes) and almost immediately vomited. He has had no nausea, no fever. He has been constipated as well, but had a very small nonliquid stool this morning. He has otherwise been doing well according to dtr, eating and drinking well, feeling pretty well. He was diagnosed with pancreatic CA in Sept 2016, had a distal pancreatectomy and was started on chemo for 6 months. Around early 2018 he was changed to a "pill form" of chemo up until about a year and a half ago when he was switched to his current regimen of 5-Fu and Onyvide q3weeks. His oncologist at [facility]) is Leola Brazil, MD. His last CT scan was last month and dtr is unclear what it showed, but was told that "everything looked stable."   ED Course: CT scan here showed SBO with obstruction at the left mid abdomen where soft tissue thickening is seen, most likely due to tumor at LUQ anteriorly. Also tumor nodule in this same area at abdominal wall 2.9x3.2 cm. LUQ infiltrative change suspicious for omental tumor/caking.  Nonspecific upper normal sized right mid abdominal mesenteric lymph nodes, thickened wall of the gastric antrum suspicious for tumor but could also be gastritis, infiltrative process, or prior radiation.  Review of Systems: As per HPI; otherwise review of systems reviewed and negative.   Ambulatory Status:  Ambulates without assistance  Past Medical History:  Diagnosis  Date  . Cancer [facility])    pancreatic  . Diabetes mellitus without complication ([place])   . Scoliosis     Past Surgical History:  Procedure Laterality Date  . ABDOMINAL SURGERY    . PANCREATECTOMY  2016    Social History   Socioeconomic History  . Marital status: Married    Spouse name: Not on file  . Number of children: Not on file  . Years of education: Not on file  . Highest education level: Not on file  Occupational History  . Not on file  Social Needs  . Financial resource strain: Not on file  . Food insecurity:    Worry: Not on file    Inability: Not on file  . Transportation needs:    Medical: Not on file    Non-medical: Not on file  Tobacco Use  . Smoking status: Current Every Day Smoker    Years: 20.00    Types: Cigarettes  Substance and Sexual Activity  . Alcohol use: Not Currently  . Drug use: Never  . Sexual activity: Not on file  Lifestyle  . Physical activity:    Days per week: Not on file    Minutes per session: Not on file  . Stress: Not on file  Relationships  . Social connections:    Talks on phone: Not on file    Gets together: Not on file    Attends religious service: Not on file    Active member of club or organization: Not on file    Attends meetings of clubs or organizations: Not on  file    Relationship status: Not on file  . Intimate partner violence:    Fear of current or ex partner: Not on file    Emotionally abused: Not on file    Physically abused: Not on file    Forced sexual activity: Not on file  Other Topics Concern  . Not on file  Social History Narrative  . Not on file    No Known Allergies  No family history on file.  Prior to Admission medications   Medication Sig Start Date End Date Taking? Authorizing Provider  acetaminophen (TYLENOL 8 HOUR ARTHRITIS PAIN) 650 MG CR tablet Take 650 mg by mouth every 8 (eight) hours as needed for pain.    Yes [provider]  azelastine (ASTELIN) 0.1 % nasal spray Place 2  sprays into both nostrils as needed. 02/07/18  Yes [provider]  Cholecalciferol (VITAMIN D3) 50 MCG (2000 UT) capsule Take 2,000 Units by mouth daily. 01/07/18  Yes [provider]  diclofenac sodium (VOLTAREN) 1 % GEL Apply 1 application topically as needed. 01/22/18  Yes [provider]  finasteride (PROSCAR) 5 MG tablet Take 5 mg by mouth at bedtime.  02/06/18  Yes [provider]  HYDROcodone-acetaminophen (NORCO/VICODIN) 5-325 MG tablet Take 1 tablet by mouth every 6 (six) hours as needed for moderate pain or severe pain.  01/26/18  Yes [provider]  levothyroxine (SYNTHROID, LEVOTHROID) 50 MCG tablet Take 50 mcg by mouth daily. 02/23/18  Yes [provider]  metFORMIN (GLUCOPHAGE) 500 MG tablet Take 500 mg by mouth 2 (two) times daily. 12/12/17  Yes [provider]  MYRBETRIQ 25 MG TB24 tablet Take 25 mg by mouth as needed (Bladder).  02/07/18  Yes [provider]  ondansetron (ZOFRAN) 4 MG tablet Take 4 mg by mouth as needed. 02/21/18  Yes [provider]  pantoprazole (PROTONIX) 40 MG tablet Take 40 mg by mouth at bedtime. 02/09/18  Yes [provider]  tamsulosin (FLOMAX) 0.4 MG CAPS capsule Take 0.4 mg by mouth every evening. 02/07/18  Yes [provider]  vitamin B-12 (CYANOCOBALAMIN) 500 MCG tablet Take 500 mcg by mouth daily. 01/02/18  Yes [provider]    Physical Exam: Vitals:   03/04/18 1145 03/04/18 1200 03/04/18 1215 03/04/18 1245  BP: 138/83 120/66 127/71 136/76  Pulse: 78 78 81 82  Resp: 17 10 (!) 24 (!) 22  Temp:      TempSrc:      SpO2: 98% 96% 98% 98%  Weight:      Height:         . General: Chronically ill appearing male, calm and comfortable and is in NAD . Eyes:  PERRL, EOMI, normal lids, iris . ENT:  grossly normal hearing, lips & tongue, mmm . Neck:  supple, no lymphadenopathy . Cardiovascular:  nL S1, S2, normal rate, reg rhythm, no  murmur. Marland Kitchen Respiratory:   CTA bilaterally with no wheezes/rales/rhonchi.  Normal respiratory effort. . Abdomen:  Mildly distended, tender to moderate palpation in epigastric and LUQ regions, no R/G . Back:   grossly normal alignment . Skin:  no rash or lesions seen on limited exam . Musculoskeletal:  grossly normal tone BUE/BLE, good ROM, no bony abnormality or obvious joint deformity . Lower extremities:  No LE edema.  Limited foot exam with no ulcerations.  2+ distal pulses. Marland Kitchen Psychiatric:  grossly normal mood and affect, speech fluent and appropriate, AOx3 . Neurologic:  CN 2-12 grossly intact, moves  all extremities in coordinated fashion, sensation intact, Patellar DTRs 2+ and symmetric    Radiological Exams on Admission: Ct Abdomen Pelvis W Contrast  Result Date: 03/04/2018 CLINICAL DATA:  Abdominal pain, nausea, vomiting, history of pancreatic cancer, diabetes mellitus, smoking EXAM: CT ABDOMEN AND PELVIS WITH CONTRAST TECHNIQUE: Multidetector CT imaging of the abdomen and pelvis was performed using the standard protocol following bolus administration of intravenous contrast. Sagittal and coronal MPR images reconstructed from axial data set. CONTRAST:  124mL OMNIPAQUE IOHEXOL 300 MG/ML SOLN IV. No oral contrast. COMPARISON:  None FINDINGS: Lower chest: Bibasilar atelectasis with question additional infiltrate in the RIGHT lower lobe. Hepatobiliary: Mild fatty infiltration of liver. Gallbladder surgically absent. No biliary dilatation. Pancreas: Prior resection of pancreatic tail. Pancreatic head, body and proximal tail normal appearance. No definite pancreatic mass. Spleen: Surgically absent Adrenals/Urinary Tract: Adrenal glands normal appearance. BILATERAL peripelvic renal cysts. No definite renal mass, urinary tract calcification, hydronephrosis or ureteral dilatation. Bladder unremarkable. Stomach/Bowel: Multiple dilated small bowel loops in the LEFT mid abdomen with transition to normal  caliber in the anterior LEFT mid abdomen. At the site of transition, small bowel loop appears thickened with adjacent thickening of anterior abdominal wall soft tissue, could reflect scarring/fibrosis is a result of prior surgery or could reflect tumor recurrence at site. Wall thickening of the gastric antrum. Contracted body stomach with normal appearing proximal stomach. Distal small bowel loops and colon decompressed. Few scattered diverticula of sigmoid colon without diverticulitis. Vascular/Lymphatic: Atherosclerotic calcifications aorta and iliac arteries without aneurysm. Coronary arterial calcification. Tip of central venous catheter at superior RIGHT atrium. Few upper normal sized mesenteric lymph nodes in the RIGHT mid abdomen, nonspecific, 9 mm nodes images 48, 50 and 55. Reproductive: Unremarkable prostate gland Other: Infiltrative changes present in the LEFT upper quadrant under the diaphragm antral laterally, anterior and inferior to the stomach, question omental tumor. Additional tumor nodule infiltrate in the anterior abdominal wall LEFT mid abdomen 3.2 x 2.9 x 2.5 cm with central necrosis image 57. Two additional enhancing nodules in the RIGHT mid anterior abdominal wall measuring 2.1 x 1.9 cm and 1.6 x 1.5 cm question tumor. No free air or free fluid. Postsurgical changes of the anterior abdominal wall. Small ventral hernia in epigastrium containing a nondilated small bowel loop. Musculoskeletal: Rotary scoliosis and multilevel degenerative changes of the thoracolumbar spine. IMPRESSION: Small-bowel obstruction with dilated proximal and decompressed distal small bowel loops, due to obstruction at the anterior LEFT mid abdomen where soft tissue thickening is identified; this is most likely due to tumor at the LEFT upper quadrant anteriorly. Tumor nodule at the LEFT mid abdomen anterior abdominal wall 2.9 x 3.2 cm. Additional soft tissue infiltrative change in the LEFT upper quadrant anterolateral  and inferior to the stomach is suspicious for omental tumor/caking. Nonspecific upper normal sized RIGHT mid abdominal mesenteric lymph nodes. Thickened wall of gastric antrum suspicious for tumor though this could also be seen with gastritis, infiltrative processes and prior radiation. Herniation of a small bowel loop into the epigastrium. Electronically Signed   By: Lavonia Dana M.D.   On: 03/04/2018 12:33    EKG: Independently reviewed.  Date/Time:                  Wednesday March 04 2018 10:07:01 EST Ventricular Rate:         83 PR Interval:                   QRS Duration: 83 QT Interval:  397 QTC Calculation:        467 R Axis:                         0 Text Interpretation:       Sinus rhythm Low voltage, precordial leads rate is faster compared to 2010 Confirmed by Sherwood Gambler [phone]) on 03/04/2018 10:26:50 AM   Labs on Admission: I have personally reviewed the available labs and imaging studies at the time of the admission.  Pertinent labs:  COMPREHENSIVE METABOLIC PANEL - Abnormal; Notable for the following components:      Result Value    Sodium 134 (*)    Glucose, Bld 148 (*)    Albumin 3.0 (*)    All other components within normal limits  CBC WITH DIFFERENTIAL/PLATELET - Abnormal; Notable for the following components:   WBC 11.8 (*)    RBC 3.76 (*)    Hemoglobin 9.9 (*)    HCT 32.2 (*)    RDW 21.2 (*)    Platelets 448 (*)    Neutro Abs 9.9 (*)    Monocytes Absolute 1.1 (*)    All other components within normal limits  URINALYSIS, ROUTINE W REFLEX MICROSCOPIC - Abnormal; Notable for the following components:   Specific Gravity, Urine 1.032 (*)    All other components within normal limits  LIPASE, BLOOD  I-STAT TROPONIN, ED       Assessment/Plan Principal Problem:   SBO (small bowel obstruction) (HCC) Active Problems:   Dyslipidemia   Hypothyroidism   Pancreatic adenocarcinoma (HCC)   SBO: this could be due to  adhesions but given apparent large tumor burden on CT, this could be the etiology. Need to see scan from November to compare but this is not uploaded in [place]. It is my understanding that surgery team is attempting to get this sent.  -admit to inpatient -bowel rest/ NPO -NS at 125 cc/hour -NGT per surgery -appreciate surgery recommendations -avoid opiates if possible  Pancreatic CA: -await CT to be sent from his primary oncologist (Dr. Nechama Guard of [place]) to compare -on chemo q3weeks, next due Jan 4  Hypothyroidism -convert synthroid to IV while NPO  HLD -cont statin when taking po   DVT prophylaxis: lovenox Code Status: Full - confirmed with patient/family (they are still discussing this) Family Communication: wife and daughter at bedside  Disposition Plan:  Home once clinically improved Consults called: surgery  Admission status: Admit - It is my clinical opinion that admission to INPATIENT is reasonable and necessary because of the expectation that this patient will require hospital care that crosses at least 2 midnights to treat this condition based on the medical complexity of the problems presented.  Given the aforementioned information, the predictability of an adverse outcome is felt to be significant.     Janora Norlander MD [facility]  If note is complete, please contact covering daytime or nighttime physician. [email or web address] Password [email or web address]  03/04/2018, 2:22 PM

## 2018-03-04 NOTE — ED Notes (Signed)
Got patient on the monitor did vitals patient is resting with nurse and family at bedside and call bell in reach

## 2018-03-04 NOTE — Consult Note (Addendum)
[facility] Surgery Consult Note  Tony Austin 1933/10/05  [id number].    Requesting MD: Regenia Skeeter Chief Complaint/Reason for Consult: sbo  HPI:  Patient is a 84 year old male who presented to [facility] with abdominal pain and vomiting. Abdominal pain is a mild cramping pain that is intermittent, started about a week ago. Patient called his oncologist at home because he had never experienced these symptoms before and she said if the symptoms became worse or more persistent that he would need to be evaluated. He was feeling much better early this week but then overnight developed the same mild pain and vomited. Patient had a soft stool yesterday and a very small stool this AM. Patient is currently on chemotherapy for peritoneal mets from pancreatic cancer. He was diagnosed with pancreatic cancer in 2016, underwent what sounds like a distal pancreatectomy and chemotherapy at that time. His current chemotherapy was started 1-1.5 years ago. He denies other health issues. NKDA. He also has had multiple hernia repairs and may have abdominal mesh. Patient was here visiting family over the holidays. He lives in [place] and gets his medical care at [facility] Daughter was present at the bedside and assisted with history.   ROS: Review of Systems  Constitutional: Negative for chills and fever.  Respiratory: Negative for shortness of breath and wheezing.   Cardiovascular: Negative for chest pain and palpitations.  Gastrointestinal: Positive for abdominal pain and vomiting. Negative for constipation, diarrhea and nausea.  Genitourinary: Negative for dysuria, frequency and urgency.  All other systems reviewed and are negative.   No family history on file.  Past Medical History:  Diagnosis Date  . Cancer [facility])    pancreatic  . Diabetes mellitus without complication ([place])   . Scoliosis      Social History:  reports that he has been smoking cigarettes. He has smoked for the past 20.00 years. He does not  have any smokeless tobacco history on file. He reports that he drank alcohol. He reports that he does not use drugs.  Allergies: No Known Allergies   (Not in a hospital admission)  Blood pressure 136/76, pulse 82, temperature 98.2 F (36.8 C), temperature source Oral, resp. rate (!) 22, height 5\' 5"  (1.651 m), weight 52.6 kg, SpO2 98 %. Physical Exam: Physical Exam  Constitutional: He is oriented to person, place, and time. He appears well-developed. He appears cachectic. He is cooperative. He appears ill.  HENT:  Head: Normocephalic and atraumatic.  Right Ear: External ear normal.  Left Ear: External ear normal.  Eyes: Pupils are equal, round, and reactive to light. Conjunctivae, EOM and lids are normal. No scleral icterus.  Neck: Normal range of motion. Neck supple.  Cardiovascular: Normal rate and regular rhythm.  Pulses:      Dorsalis pedis pulses are 2+ on the right side, and 2+ on the left side.  Pulmonary/Chest: Effort normal and breath sounds normal.  Port in R subclavian space  Abdominal: Soft. Bowel sounds are normal. He exhibits no distension. There is no hepatosplenomegaly. There is no tenderness.  Musculoskeletal:  ROM grossly intact in bilateral upper and lower extremities  Neurological: He is alert and oriented to person, place, and time.  Skin: Skin is warm, dry and intact.  Psychiatric: He has a normal mood and affect. His speech is normal and behavior is normal.    Results for orders placed or performed during the hospital encounter of 03/04/18 (from the past 48 hour(s))  Urinalysis, Routine w reflex microscopic  Status: Abnormal   Collection Time: 03/04/18  9:37 AM  Result Value Ref Range   Color, Urine YELLOW YELLOW   APPearance CLEAR CLEAR   Specific Gravity, Urine 1.032 (H) 1.005 - 1.030   pH 7.0 5.0 - 8.0   Glucose, UA NEGATIVE NEGATIVE mg/dL   Hgb urine dipstick NEGATIVE NEGATIVE   Bilirubin Urine NEGATIVE NEGATIVE   Ketones, ur NEGATIVE NEGATIVE  mg/dL   Protein, ur NEGATIVE NEGATIVE mg/dL   Nitrite NEGATIVE NEGATIVE   Leukocytes, UA NEGATIVE NEGATIVE    Comment: Performed at [facility] [address], [place], [place] [address]  Comprehensive metabolic panel     Status: Abnormal   Collection Time: 03/04/18 10:03 AM  Result Value Ref Range   Sodium 134 (L) 135 - 145 mmol/L   Potassium 3.8 3.5 - 5.1 mmol/L   Chloride 101 98 - 111 mmol/L   CO2 23 22 - 32 mmol/L   Glucose, Bld 148 (H) 70 - 99 mg/dL   BUN 13 8 - 23 mg/dL   Creatinine, Ser 0.68 0.61 - 1.24 mg/dL   Calcium 9.0 8.9 - 10.3 mg/dL   Total Protein 6.5 6.5 - 8.1 g/dL   Albumin 3.0 (L) 3.5 - 5.0 g/dL   AST 41 15 - 41 U/L   ALT 39 0 - 44 U/L   Alkaline Phosphatase 102 38 - 126 U/L   Total Bilirubin 0.9 0.3 - 1.2 mg/dL   GFR calc non Af Amer >60 >60 mL/min   GFR calc Af Amer >60 >60 mL/min   Anion gap 10 5 - 15    Comment: Performed at [facility], [place] [address], [place], [place] [address]  Lipase, blood     Status: None   Collection Time: 03/04/18 10:03 AM  Result Value Ref Range   Lipase 21 11 - 51 U/L    Comment: Performed at [facility] [address] [place], [place] [address]  CBC with Differential     Status: Abnormal   Collection Time: 03/04/18 10:03 AM  Result Value Ref Range   WBC 11.8 (H) 4.0 - 10.5 K/uL   RBC 3.76 (L) 4.22 - 5.81 MIL/uL   Hemoglobin 9.9 (L) 13.0 - 17.0 g/dL   HCT 32.2 (L) 39.0 - 52.0 %   MCV 85.6 80.0 - 100.0 fL   MCH 26.3 26.0 - 34.0 pg   MCHC 30.7 30.0 - 36.0 g/dL   RDW 21.2 (H) 11.5 - 15.5 %   Platelets 448 (H) 150 - 400 K/uL   nRBC 0.0 0.0 - 0.2 %   Neutrophils Relative % 84 %   Neutro Abs 9.9 (H) 1.7 - 7.7 K/uL   Lymphocytes Relative 7 %   Lymphs Abs 0.8 0.7 - 4.0 K/uL   Monocytes Relative 9 %   Monocytes Absolute 1.1 (H) 0.1 - 1.0 K/uL   Eosinophils Relative 0 %   Eosinophils Absolute 0.0 0.0 - 0.5 K/uL   Basophils Relative 0 %   Basophils Absolute 0.0 0.0 - 0.1 K/uL   nRBC 0 0 /100 WBC   Abs  Immature Granulocytes 0.00 0.00 - 0.07 K/uL   Acanthocytes PRESENT    Tammy Sours Bodies PRESENT    Target Cells PRESENT     Comment: Performed at [facility], [place] [address] [place], [place] [address]  I-stat troponin, ED     Status: None   Collection Time: 03/04/18 10:11 AM  Result Value Ref Range  Troponin i, poc 0.00 0.00 - 0.08 ng/mL   Comment 3            Comment: Due to the release kinetics of cTnI, a negative result within the first hours of the onset of symptoms does not rule out myocardial infarction with certainty. If myocardial infarction is still suspected, repeat the test at appropriate intervals.    Ct Abdomen Pelvis W Contrast  Result Date: 03/04/2018 CLINICAL DATA:  Abdominal pain, nausea, vomiting, history of pancreatic cancer, diabetes mellitus, smoking EXAM: CT ABDOMEN AND PELVIS WITH CONTRAST TECHNIQUE: Multidetector CT imaging of the abdomen and pelvis was performed using the standard protocol following bolus administration of intravenous contrast. Sagittal and coronal MPR images reconstructed from axial data set. CONTRAST:  111mL OMNIPAQUE IOHEXOL 300 MG/ML SOLN IV. No oral contrast. COMPARISON:  None FINDINGS: Lower chest: Bibasilar atelectasis with question additional infiltrate in the RIGHT lower lobe. Hepatobiliary: Mild fatty infiltration of liver. Gallbladder surgically absent. No biliary dilatation. Pancreas: Prior resection of pancreatic tail. Pancreatic head, body and proximal tail normal appearance. No definite pancreatic mass. Spleen: Surgically absent Adrenals/Urinary Tract: Adrenal glands normal appearance. BILATERAL peripelvic renal cysts. No definite renal mass, urinary tract calcification, hydronephrosis or ureteral dilatation. Bladder unremarkable. Stomach/Bowel: Multiple dilated small bowel loops in the LEFT mid abdomen with transition to normal caliber in the anterior LEFT mid abdomen. At the site of transition, small bowel loop appears  thickened with adjacent thickening of anterior abdominal wall soft tissue, could reflect scarring/fibrosis is a result of prior surgery or could reflect tumor recurrence at site. Wall thickening of the gastric antrum. Contracted body stomach with normal appearing proximal stomach. Distal small bowel loops and colon decompressed. Few scattered diverticula of sigmoid colon without diverticulitis. Vascular/Lymphatic: Atherosclerotic calcifications aorta and iliac arteries without aneurysm. Coronary arterial calcification. Tip of central venous catheter at superior RIGHT atrium. Few upper normal sized mesenteric lymph nodes in the RIGHT mid abdomen, nonspecific, 9 mm nodes images 48, 50 and 55. Reproductive: Unremarkable prostate gland Other: Infiltrative changes present in the LEFT upper quadrant under the diaphragm antral laterally, anterior and inferior to the stomach, question omental tumor. Additional tumor nodule infiltrate in the anterior abdominal wall LEFT mid abdomen 3.2 x 2.9 x 2.5 cm with central necrosis image 57. Two additional enhancing nodules in the RIGHT mid anterior abdominal wall measuring 2.1 x 1.9 cm and 1.6 x 1.5 cm question tumor. No free air or free fluid. Postsurgical changes of the anterior abdominal wall. Small ventral hernia in epigastrium containing a nondilated small bowel loop. Musculoskeletal: Rotary scoliosis and multilevel degenerative changes of the thoracolumbar spine. IMPRESSION: Small-bowel obstruction with dilated proximal and decompressed distal small bowel loops, due to obstruction at the anterior LEFT mid abdomen where soft tissue thickening is identified; this is most likely due to tumor at the LEFT upper quadrant anteriorly. Tumor nodule at the LEFT mid abdomen anterior abdominal wall 2.9 x 3.2 cm. Additional soft tissue infiltrative change in the LEFT upper quadrant anterolateral and inferior to the stomach is suspicious for omental tumor/caking. Nonspecific upper normal  sized RIGHT mid abdominal mesenteric lymph nodes. Thickened wall of gastric antrum suspicious for tumor though this could also be seen with gastritis, infiltrative processes and prior radiation. Herniation of a small bowel loop into the epigastrium. Electronically Signed   By: Lavonia Dana M.D.   On: 03/04/2018 12:33      Assessment/Plan Pancreatic cancer with peritoneal mets  sbo - insert NGT for decompression - would not  initiate small bowel protocol since we do not know what specifically is causing obstruction - IVF and monitor electrolytes  Recommend admission to medical service, we will follow along as well.   Brigid Re, [facility] Surgery 03/04/2018, 1:33 PM Pager: [phone] Consults: [phone] Mon-Fri 7:00 am-4:30 pm Sat-Sun 7:00 am-11:30 am

## 2018-03-04 NOTE — ED Provider Notes (Signed)
[facility] EMERGENCY DEPARTMENT Provider Note   CSN: [id number] Arrival date & time: 03/04/18  0920     History   Chief Complaint Chief Complaint  Patient presents with  . Abdominal Pain    HPI Desiree Fleming is a 84 y.o. male.  HPI  84 year old male with a history of pancreatic cancer currently on chemotherapy presents with abdominal pain and vomiting.  History is taken from patient but also daughter.  Last received chemotherapy in November.  He is from [place] and is receiving his care at [facility].  The patient has been having a little bit of on and off abdominal pain for about a week 1 week ago.  He is currently visiting from Thanksgiving to early January.  However last night the abdominal pain seemed to return and he had 2 episodes of emesis and 2 episodes of diarrhea.  No blood in either.  Called his oncologist who recommended coming to the ER and a CT scan.  The patient rates his pain about a 6 out of 10 currently.  No back pain or chest pain.  No fevers or urinary symptoms.   Past Medical History:  Diagnosis Date  . Cancer [facility])    pancreatic  . Diabetes mellitus without complication ([place])   . Scoliosis     Patient Active Problem List   Diagnosis Date Noted  . SBO (small bowel obstruction) ([place]) 03/04/2018  . Dyslipidemia 03/04/2018  . Hypothyroidism 07/23/2017  . Pancreatic adenocarcinoma ([place]) 01/17/2017          Home Medications    Prior to Admission medications   Medication Sig Start Date End Date Taking? Authorizing Provider  acetaminophen (TYLENOL 8 HOUR ARTHRITIS PAIN) 650 MG CR tablet Take 650 mg by mouth every 8 (eight) hours as needed for pain.    Yes [provider]  azelastine (ASTELIN) 0.1 % nasal spray Place 2 sprays into both nostrils as needed. 02/07/18  Yes [provider]  Cholecalciferol (VITAMIN D3) 50 MCG (2000 UT) capsule Take 2,000 Units by mouth daily. 01/07/18  Yes [provider]  diclofenac  sodium (VOLTAREN) 1 % GEL Apply 1 application topically as needed. 01/22/18  Yes [provider]  finasteride (PROSCAR) 5 MG tablet Take 5 mg by mouth at bedtime.  02/06/18  Yes [provider]  HYDROcodone-acetaminophen (NORCO/VICODIN) 5-325 MG tablet Take 1 tablet by mouth every 6 (six) hours as needed for moderate pain or severe pain.  01/26/18  Yes [provider]  levothyroxine (SYNTHROID, LEVOTHROID) 50 MCG tablet Take 50 mcg by mouth daily. 02/23/18  Yes [provider]  metFORMIN (GLUCOPHAGE) 500 MG tablet Take 500 mg by mouth 2 (two) times daily. 12/12/17  Yes [provider]  MYRBETRIQ 25 MG TB24 tablet Take 25 mg by mouth as needed (Bladder).  02/07/18  Yes [provider]  ondansetron (ZOFRAN) 4 MG tablet Take 4 mg by mouth as needed. 02/21/18  Yes [provider]  pantoprazole (PROTONIX) 40 MG tablet Take 40 mg by mouth at bedtime. 02/09/18  Yes [provider]  tamsulosin (FLOMAX) 0.4 MG CAPS capsule Take 0.4 mg by mouth every evening. 02/07/18  Yes [provider]  vitamin B-12 (CYANOCOBALAMIN) 500 MCG tablet Take 500 mcg by mouth daily. 01/02/18  Yes [provider]    Family History No family history on file.  Social History Social History   Tobacco Use  . Smoking status: Current Every Day Smoker    Years: 20.00  Types: Cigarettes  Substance Use Topics  . Alcohol use: Not Currently  . Drug use: Never     Allergies   Patient has no known allergies.   Review of Systems Review of Systems  Constitutional: Negative for fever.  Respiratory: Negative for shortness of breath.   Cardiovascular: Negative for chest pain.  Gastrointestinal: Positive for abdominal pain, diarrhea and vomiting. Negative for blood in stool.  Genitourinary: Negative for dysuria.  Musculoskeletal: Negative for back pain.  All other systems reviewed and are negative.    Physical Exam Updated Vital  Signs BP 136/76   Pulse 82   Temp 98.2 F (36.8 C) (Oral)   Resp (!) 22   Ht 5\' 5"  (1.651 m)   Wt 52.6 kg   SpO2 98%   BMI 19.30 kg/m   Physical Exam  Constitutional: He appears well-developed and well-nourished.  Non-toxic appearance. He does not appear ill. No distress.  HENT:  Head: Normocephalic and atraumatic.  Right Ear: External ear normal.  Left Ear: External ear normal.  Nose: Nose normal.  Eyes: Right eye exhibits no discharge. Left eye exhibits no discharge.  Neck: Neck supple.  Cardiovascular: Normal rate, regular rhythm and normal heart sounds.  Pulmonary/Chest: Effort normal and breath sounds normal.  Abdominal: Soft. There is tenderness in the periumbilical area and suprapubic area. A hernia is present. Hernia confirmed positive in the ventral area.    Musculoskeletal: He exhibits no edema.  Neurological: He is alert.  Skin: Skin is warm and dry.  Psychiatric: His mood appears not anxious.  Nursing note and vitals reviewed.    ED Treatments / Results  Labs (all labs ordered are listed, but only abnormal results are displayed) Labs Reviewed  COMPREHENSIVE METABOLIC PANEL - Abnormal; Notable for the following components:      Result Value   Sodium 134 (*)    Glucose, Bld 148 (*)    Albumin 3.0 (*)    All other components within normal limits  CBC WITH DIFFERENTIAL/PLATELET - Abnormal; Notable for the following components:   WBC 11.8 (*)    RBC 3.76 (*)    Hemoglobin 9.9 (*)    HCT 32.2 (*)    RDW 21.2 (*)    Platelets 448 (*)    Neutro Abs 9.9 (*)    Monocytes Absolute 1.1 (*)    All other components within normal limits  URINALYSIS, ROUTINE W REFLEX MICROSCOPIC - Abnormal; Notable for the following components:   Specific Gravity, Urine 1.032 (*)    All other components within normal limits  LIPASE, BLOOD  I-STAT TROPONIN, ED    EKG EKG Interpretation  Date/Time:  Wednesday March 04 2018 10:07:01 EST Ventricular Rate:  83 PR  Interval:    QRS Duration: 83 QT Interval:  397 QTC Calculation: 467 R Axis:   0 Text Interpretation:  Sinus rhythm Low voltage, precordial leads rate is faster compared to 2010 Confirmed by Sherwood Gambler [phone]) on 03/04/2018 10:26:50 AM   Radiology Ct Abdomen Pelvis W Contrast  Result Date: 03/04/2018 CLINICAL DATA:  Abdominal pain, nausea, vomiting, history of pancreatic cancer, diabetes mellitus, smoking EXAM: CT ABDOMEN AND PELVIS WITH CONTRAST TECHNIQUE: Multidetector CT imaging of the abdomen and pelvis was performed using the standard protocol following bolus administration of intravenous contrast. Sagittal and coronal MPR images reconstructed from axial data set. CONTRAST:  111mL OMNIPAQUE IOHEXOL 300 MG/ML SOLN IV. No oral contrast. COMPARISON:  None FINDINGS: Lower chest: Bibasilar atelectasis with question additional infiltrate in the RIGHT  lower lobe. Hepatobiliary: Mild fatty infiltration of liver. Gallbladder surgically absent. No biliary dilatation. Pancreas: Prior resection of pancreatic tail. Pancreatic head, body and proximal tail normal appearance. No definite pancreatic mass. Spleen: Surgically absent Adrenals/Urinary Tract: Adrenal glands normal appearance. BILATERAL peripelvic renal cysts. No definite renal mass, urinary tract calcification, hydronephrosis or ureteral dilatation. Bladder unremarkable. Stomach/Bowel: Multiple dilated small bowel loops in the LEFT mid abdomen with transition to normal caliber in the anterior LEFT mid abdomen. At the site of transition, small bowel loop appears thickened with adjacent thickening of anterior abdominal wall soft tissue, could reflect scarring/fibrosis is a result of prior surgery or could reflect tumor recurrence at site. Wall thickening of the gastric antrum. Contracted body stomach with normal appearing proximal stomach. Distal small bowel loops and colon decompressed. Few scattered diverticula of sigmoid colon without  diverticulitis. Vascular/Lymphatic: Atherosclerotic calcifications aorta and iliac arteries without aneurysm. Coronary arterial calcification. Tip of central venous catheter at superior RIGHT atrium. Few upper normal sized mesenteric lymph nodes in the RIGHT mid abdomen, nonspecific, 9 mm nodes images 48, 50 and 55. Reproductive: Unremarkable prostate gland Other: Infiltrative changes present in the LEFT upper quadrant under the diaphragm antral laterally, anterior and inferior to the stomach, question omental tumor. Additional tumor nodule infiltrate in the anterior abdominal wall LEFT mid abdomen 3.2 x 2.9 x 2.5 cm with central necrosis image 57. Two additional enhancing nodules in the RIGHT mid anterior abdominal wall measuring 2.1 x 1.9 cm and 1.6 x 1.5 cm question tumor. No free air or free fluid. Postsurgical changes of the anterior abdominal wall. Small ventral hernia in epigastrium containing a nondilated small bowel loop. Musculoskeletal: Rotary scoliosis and multilevel degenerative changes of the thoracolumbar spine. IMPRESSION: Small-bowel obstruction with dilated proximal and decompressed distal small bowel loops, due to obstruction at the anterior LEFT mid abdomen where soft tissue thickening is identified; this is most likely due to tumor at the LEFT upper quadrant anteriorly. Tumor nodule at the LEFT mid abdomen anterior abdominal wall 2.9 x 3.2 cm. Additional soft tissue infiltrative change in the LEFT upper quadrant anterolateral and inferior to the stomach is suspicious for omental tumor/caking. Nonspecific upper normal sized RIGHT mid abdominal mesenteric lymph nodes. Thickened wall of gastric antrum suspicious for tumor though this could also be seen with gastritis, infiltrative processes and prior radiation. Herniation of a small bowel loop into the epigastrium. Electronically Signed   By: Lavonia Dana M.D.   On: 03/04/2018 12:33    Procedures Procedures (including critical care  time)  Medications Ordered in ED Medications  acetaminophen (TYLENOL) CR tablet 650 mg (has no administration in time range)  HYDROcodone-acetaminophen (NORCO/VICODIN) 5-325 MG per tablet 1 tablet (has no administration in time range)  levothyroxine (SYNTHROID, LEVOTHROID) tablet 50 mcg (has no administration in time range)  azelastine (ASTELIN) 0.1 % nasal spray 2 spray (has no administration in time range)  enoxaparin (LOVENOX) injection 30 mg (has no administration in time range)  docusate sodium (COLACE) capsule 100 mg (has no administration in time range)  bisacodyl (DULCOLAX) suppository 10 mg (has no administration in time range)  ondansetron (ZOFRAN) tablet 4 mg (has no administration in time range)    Or  ondansetron (ZOFRAN) injection 4 mg (has no administration in time range)  0.9 %  sodium chloride infusion ( Intravenous New Bag/Given 03/04/18 1420)  sodium chloride 0.9 % bolus 500 mL (0 mLs Intravenous Stopped 03/04/18 1122)  iohexol (OMNIPAQUE) 300 MG/ML solution 100 mL (100 mLs Intravenous Contrast  Given 03/04/18 1156)     Initial Impression / Assessment and Plan / ED Course  I have reviewed the triage vital signs and the nursing notes.  Pertinent labs & imaging results that were available during my care of the patient were reviewed by me and considered in my medical decision making (see chart for details).     Patient CT scan shows small bowel obstruction, appears to be coming from his mass/cancerous lesions.  I discussed with his oncologist, Dr. Nechama Guard, and she was updated on his course and care.  Patient apparently has significant pancreatic cancer there is currently on chemotherapy but his outlook is not good.  Surgery was consulted and the hospitalist service will admit for symptomatic treatment.  Final Clinical Impressions(s) / ED Diagnoses   Final diagnoses:  Small bowel obstruction [facility])    ED Discharge Orders    None       Sherwood Gambler, MD 03/04/18  1432

## 2018-03-04 NOTE — ED Triage Notes (Signed)
Pt c/o abd pain/hernia that began last night ; pt states he has had the hernia x 3 years  pt also c/o n/v ; no active vomiting at this time ' pt receiving chemotherapy for pancreatic cancer

## 2018-03-05 ENCOUNTER — Encounter (HOSPITAL_COMMUNITY): Payer: Self-pay | Admitting: General Practice

## 2018-03-05 DIAGNOSIS — K56609 Unspecified intestinal obstruction, unspecified as to partial versus complete obstruction: Secondary | ICD-10-CM

## 2018-03-05 DIAGNOSIS — C259 Malignant neoplasm of pancreas, unspecified: Secondary | ICD-10-CM

## 2018-03-05 DIAGNOSIS — E039 Hypothyroidism, unspecified: Secondary | ICD-10-CM

## 2018-03-05 DIAGNOSIS — E785 Hyperlipidemia, unspecified: Secondary | ICD-10-CM

## 2018-03-05 LAB — COMPREHENSIVE METABOLIC PANEL
ALT: 27 U/L (ref 0–44)
AST: 25 U/L (ref 15–41)
Albumin: 2.6 g/dL — ABNORMAL LOW (ref 3.5–5.0)
Alkaline Phosphatase: 90 U/L (ref 38–126)
Anion gap: 9 (ref 5–15)
BUN: 10 mg/dL (ref 8–23)
CO2: 23 mmol/L (ref 22–32)
Calcium: 8.4 mg/dL — ABNORMAL LOW (ref 8.9–10.3)
Chloride: 103 mmol/L (ref 98–111)
Creatinine, Ser: 0.6 mg/dL — ABNORMAL LOW (ref 0.61–1.24)
GFR calc Af Amer: >60 mL/min (ref >60)
GFR calc non Af Amer: >60 mL/min (ref >60)
Glucose, Bld: 83 mg/dL (ref 70–99)
Potassium: 3.5 mmol/L (ref 3.5–5.1)
Sodium: 135 mmol/L (ref 135–145)
Total Bilirubin: 0.6 mg/dL (ref 0.3–1.2)
Total Protein: 5.6 g/dL — ABNORMAL LOW (ref 6.5–8.1)

## 2018-03-05 LAB — CBC
HCT: 29.9 % — ABNORMAL LOW (ref 39.0–52.0)
Hemoglobin: 9.1 g/dL — ABNORMAL LOW (ref 13.0–17.0)
MCH: 26 pg (ref 26.0–34.0)
MCHC: 30.4 g/dL (ref 30.0–36.0)
MCV: 85.4 fL (ref 80.0–100.0)
Platelets: 409 10*3/uL — ABNORMAL HIGH (ref 150–400)
RBC: 3.5 MIL/uL — ABNORMAL LOW (ref 4.22–5.81)
RDW: 21.2 % — ABNORMAL HIGH (ref 11.5–15.5)
WBC: 8.2 10*3/uL (ref 4.0–10.5)
nRBC: 0 % (ref 0.0–0.2)

## 2018-03-05 LAB — GLUCOSE, CAPILLARY
Glucose-Capillary: 82 mg/dL (ref 70–99)
Glucose-Capillary: 92 mg/dL (ref 70–99)
Glucose-Capillary: 88 mg/dL (ref 70–99)
Glucose-Capillary: 70 mg/dL (ref 70–99)
Glucose-Capillary: 95 mg/dL (ref 70–99)

## 2018-03-05 MED ORDER — SODIUM CHLORIDE 0.9% FLUSH
10.0000 mL | INTRAVENOUS | Status: DC | PRN
  Administered 2018-03-07: 10 mL
  Filled 2018-03-05: qty 40

## 2018-03-05 MED ORDER — INSULIN ASPART 100 UNIT/ML ~~LOC~~ SOLN
0.0000 [IU] | SUBCUTANEOUS | Status: DC
  Administered 2018-03-06: 2 [IU] via SUBCUTANEOUS

## 2018-03-05 MED ORDER — PANTOPRAZOLE SODIUM 40 MG IV SOLR
40.0000 mg | Freq: Every day | INTRAVENOUS | Status: DC
  Administered 2018-03-05 – 2018-03-07 (×3): 40 mg via INTRAVENOUS
  Filled 2018-03-05 (×3): qty 40

## 2018-03-05 NOTE — Progress Notes (Signed)
[facility]/Trauma Progress Note      Assessment/Plan Pancreatic cancer with peritoneal mets - pt lives in [place] - having flatus and BM's - clamp and allow clears, if he tolerates would pull NGT this afternoon - IVF and monitor electrolytes  FEN: NGT clamp, clears VTE: SCD's, lovenox ID: none Foley: none Follow up: TBD  DISPO: hopefully this will resolved without need for surgery.     LOS: 1 day    Subjective: CC: SBO  Having flatus and BM's. Little NGT output. No abdominal pain and pt states he is feeling better. No family at bedside. [employer] interpreter used  Objective: Vital signs in last 24 hours: Temp:  [98.3 F (36.8 C)-98.9 F (37.2 C)] 98.3 F (36.8 C) (12/12 0527) Pulse Rate:  [68-87] 68 (12/12 0527) Resp:  [10-24] 14 (12/12 0527) BP: (106-147)/(61-83) 106/61 (12/12 0527) SpO2:  [95 %-99 %] 97 % (12/12 0527) Last BM Date: 03/04/18  Intake/Output from previous day: 12/11 0701 - 12/12 0700 In: 2283 [I.V.:1783; IV Piggyback:500] Out: 700 [Urine:500; Emesis/NG output:200] Intake/Output this shift: No intake/output data recorded.  PE: Gen:  Alert, NAD, pleasant, cooperative HEENT: NGT in place with clear/yellow output Pulm:  Rate and effort normal Abd: Soft, ND, +BS, firm masses/lesions noted in left and right side of abdomen and these areas are tender. No peritonitis  Skin: no rashes noted, warm and dry   Anti-infectives: Anti-infectives (From admission, onward)   None      Lab Results:  Recent Labs    03/04/18 1003 03/05/18 0650  WBC 11.8* 8.2  HGB 9.9* 9.1*  HCT 32.2* 29.9*  PLT 448* 409*   BMET Recent Labs    03/04/18 1003 03/05/18 0650  NA 134* 135  K 3.8 3.5  CL 101 103  CO2 23 23  GLUCOSE 148* 83  BUN 13 10  CREATININE 0.68 0.60*  CALCIUM 9.0 8.4*   PT/INR No results for input(s): LABPROT, INR in the last 72 hours. CMP     Component Value Date/Time   NA 135 03/05/2018 0650   K 3.5 03/05/2018 0650    CL 103 03/05/2018 0650   CO2 23 03/05/2018 0650   GLUCOSE 83 03/05/2018 0650   BUN 10 03/05/2018 0650   CREATININE 0.60 (L) 03/05/2018 0650   CALCIUM 8.4 (L) 03/05/2018 0650   PROT 5.6 (L) 03/05/2018 0650   ALBUMIN 2.6 (L) 03/05/2018 0650   AST 25 03/05/2018 0650   ALT 27 03/05/2018 0650   ALKPHOS 90 03/05/2018 0650   BILITOT 0.6 03/05/2018 0650   GFRNONAA >60 03/05/2018 0650   GFRAA >60 03/05/2018 0650   Lipase     Component Value Date/Time   LIPASE 21 03/04/2018 1003    Studies/Results: Ct Abdomen Pelvis W Contrast  Result Date: 03/04/2018 CLINICAL DATA:  Abdominal pain, nausea, vomiting, history of pancreatic cancer, diabetes mellitus, smoking EXAM: CT ABDOMEN AND PELVIS WITH CONTRAST TECHNIQUE: Multidetector CT imaging of the abdomen and pelvis was performed using the standard protocol following bolus administration of intravenous contrast. Sagittal and coronal MPR images reconstructed from axial data set. CONTRAST:  120mL OMNIPAQUE IOHEXOL 300 MG/ML SOLN IV. No oral contrast. COMPARISON:  None FINDINGS: Lower chest: Bibasilar atelectasis with question additional infiltrate in the RIGHT lower lobe. Hepatobiliary: Mild fatty infiltration of liver. Gallbladder surgically absent. No biliary dilatation. Pancreas: Prior resection of pancreatic tail. Pancreatic head, body and proximal tail normal appearance. No definite pancreatic mass. Spleen: Surgically absent Adrenals/Urinary Tract: Adrenal glands normal appearance. BILATERAL  peripelvic renal cysts. No definite renal mass, urinary tract calcification, hydronephrosis or ureteral dilatation. Bladder unremarkable. Stomach/Bowel: Multiple dilated small bowel loops in the LEFT mid abdomen with transition to normal caliber in the anterior LEFT mid abdomen. At the site of transition, small bowel loop appears thickened with adjacent thickening of anterior abdominal wall soft tissue, could reflect scarring/fibrosis is a result of prior surgery or  could reflect tumor recurrence at site. Wall thickening of the gastric antrum. Contracted body stomach with normal appearing proximal stomach. Distal small bowel loops and colon decompressed. Few scattered diverticula of sigmoid colon without diverticulitis. Vascular/Lymphatic: Atherosclerotic calcifications aorta and iliac arteries without aneurysm. Coronary arterial calcification. Tip of central venous catheter at superior RIGHT atrium. Few upper normal sized mesenteric lymph nodes in the RIGHT mid abdomen, nonspecific, 9 mm nodes images 48, 50 and 55. Reproductive: Unremarkable prostate gland Other: Infiltrative changes present in the LEFT upper quadrant under the diaphragm antral laterally, anterior and inferior to the stomach, question omental tumor. Additional tumor nodule infiltrate in the anterior abdominal wall LEFT mid abdomen 3.2 x 2.9 x 2.5 cm with central necrosis image 57. Two additional enhancing nodules in the RIGHT mid anterior abdominal wall measuring 2.1 x 1.9 cm and 1.6 x 1.5 cm question tumor. No free air or free fluid. Postsurgical changes of the anterior abdominal wall. Small ventral hernia in epigastrium containing a nondilated small bowel loop. Musculoskeletal: Rotary scoliosis and multilevel degenerative changes of the thoracolumbar spine. IMPRESSION: Small-bowel obstruction with dilated proximal and decompressed distal small bowel loops, due to obstruction at the anterior LEFT mid abdomen where soft tissue thickening is identified; this is most likely due to tumor at the LEFT upper quadrant anteriorly. Tumor nodule at the LEFT mid abdomen anterior abdominal wall 2.9 x 3.2 cm. Additional soft tissue infiltrative change in the LEFT upper quadrant anterolateral and inferior to the stomach is suspicious for omental tumor/caking. Nonspecific upper normal sized RIGHT mid abdominal mesenteric lymph nodes. Thickened wall of gastric antrum suspicious for tumor though this could also be seen with  gastritis, infiltrative processes and prior radiation. Herniation of a small bowel loop into the epigastrium. Electronically Signed   By: Lavonia Dana M.D.   On: 03/04/2018 12:33   Dg Abd Portable 1 View  Result Date: 03/04/2018 CLINICAL DATA:  Nasal gastric tube placement EXAM: PORTABLE ABDOMEN - 1 VIEW COMPARISON:  CT abdomen and pelvis March 04, 2018 FINDINGS: Nasogastric tube tip and side port are in the stomach. Postoperative changes are noted in the upper abdomen. There are loops of mildly dilated small bowel, less pronounced than on recent CT. No free air evident. Lung bases are clear. Contrast is seen in the urinary bladder. There is arthropathy in the lumbar spine with lumbar dextroscoliosis. IMPRESSION: Nasogastric tube tip and side port in stomach. Less bowel dilatation compared to earlier in the day. A degree of bowel obstruction likely persists, however. No free air. Lung bases clear. Electronically Signed   By: Lowella Grip III M.D.   On: 03/04/2018 15:04      Kalman Drape , [facility] 03/05/2018, 9:59 AM  Pager: [phone] Mon-Wed, Friday 7:00am-4:30pm Thurs 7am-11:30am  Consults: [phone]

## 2018-03-05 NOTE — Progress Notes (Signed)
PROGRESS NOTE    Tony Austin  NFA:[id number] DOB: 01-23-1934 DOA: 03/04/2018 PCP: Out of system/in [place]   Brief Narrative:   84 year old Spanish-speaking male with history of advanced pancreatic cancer who follows oncology, Dr. Nechama Guard ([phone]) in [place] and is on palliative chemotherapy (5-fluorouracil,Onyvide) and visiting his daughter for the holidays presented with complaints of abdominal pain and CT suggested small bowel obstruction due to adhesions/tumor.  CT also reported thickened wall of gastric antrum suspicious for tumor infiltration versus gastritis versus radiation changes as well as extrapancreatic tumor masses including abdominal wall and omental caking.  Patient admitted with NG tube, IV fluids and general surgery consultation.  Subjective:  Patient seen in presence of general surgery APP and with assistance of Spanish video interpretation.  Patient reports a small bowel movement with normal-appearing stool this morning and also reports passing gas.  States abdominal pain improved to 3/10 mostly midabdominal.  He reports five-pound weight loss in the last 3 weeks.  NG tube in place with minimal output.  Objective: Vitals:   03/04/18 1445 03/04/18 1625 03/04/18 2129 03/05/18 0527  BP: (!) 143/80 (!) 147/81 123/67 106/61  Pulse: 84 82 81 68  Resp: 14 15 16 14   Temp:  98.9 F (37.2 C) 98.8 F (37.1 C) 98.3 F (36.8 C)  TempSrc:  Oral Oral Oral  SpO2: 95% 98% 96% 97%  Weight:      Height:        Intake/Output Summary (Last 24 hours) at 03/05/2018 1140 Last data filed at 03/05/2018 0654 Gross per 24 hour  Intake 1783.02 ml  Output 700 ml  Net 1083.02 ml   Filed Weights   03/04/18 0938  Weight: 52.6 kg    Physical Examination:  General exam: Appears calm and comfortable  Respiratory system: Clear to auscultation. Respiratory effort normal. Cardiovascular system: S1 & S2 heard, RRR. No JVD, murmurs, rubs, gallops or clicks. No pedal  edema. Gastrointestinal system: Abdomen soft and nondistended, mildly tender in periumbilical area.  Palpable upper anterior abdominal wall masses/old surgical scar and associated incisional hernias. Bowel sounds heard. Central nervous system: Alert and oriented. No focal neurological deficits. Extremities: Symmetric 5 x 5 power. Skin: No rashes, lesions or ulcers Psychiatry: Judgement and insight appear normal. Mood & affect appropriate.     Data Reviewed: I have personally reviewed following labs and imaging studies  CBC: Recent Labs  Lab 03/04/18 1003 03/05/18 0650  WBC 11.8* 8.2  NEUTROABS 9.9*  --   HGB 9.9* 9.1*  HCT 32.2* 29.9*  MCV 85.6 85.4  PLT 448* 132*   Basic Metabolic Panel: Recent Labs  Lab 03/04/18 1003 03/05/18 0650  NA 134* 135  K 3.8 3.5  CL 101 103  CO2 23 23  GLUCOSE 148* 83  BUN 13 10  CREATININE 0.68 0.60*  CALCIUM 9.0 8.4*   GFR: Estimated Creatinine Clearance: 53 mL/min (A) (by C-G formula based on SCr of 0.6 mg/dL (L)). Liver Function Tests: Recent Labs  Lab 03/04/18 1003 03/05/18 0650  AST 41 25  ALT 39 27  ALKPHOS 102 90  BILITOT 0.9 0.6  PROT 6.5 5.6*  ALBUMIN 3.0* 2.6*   Recent Labs  Lab 03/04/18 1003  LIPASE 21   No results for input(s): AMMONIA in the last 168 hours. Coagulation Profile: No results for input(s): INR, PROTIME in the last 168 hours. Cardiac Enzymes: No results for input(s): CKTOTAL, CKMB, CKMBINDEX, TROPONINI in the last 168 hours. BNP (last 3 results) No results for input(s): PROBNP in  the last 8760 hours. HbA1C: No results for input(s): HGBA1C in the last 72 hours. CBG: Recent Labs  Lab 03/05/18 0315 03/05/18 0823  GLUCAP 82 92   Lipid Profile: No results for input(s): CHOL, HDL, LDLCALC, TRIG, CHOLHDL, LDLDIRECT in the last 72 hours. Thyroid Function Tests: No results for input(s): TSH, T4TOTAL, FREET4, T3FREE, THYROIDAB in the last 72 hours. Anemia Panel: No results for input(s):  VITAMINB12, FOLATE, FERRITIN, TIBC, IRON, RETICCTPCT in the last 72 hours. Sepsis Labs: No results for input(s): PROCALCITON, LATICACIDVEN in the last 168 hours.  No results found for this or any previous visit (from the past 240 hour(s)).       Radiology Studies: Ct Abdomen Pelvis W Contrast  Result Date: 03/04/2018 CLINICAL DATA:  Abdominal pain, nausea, vomiting, history of pancreatic cancer, diabetes mellitus, smoking EXAM: CT ABDOMEN AND PELVIS WITH CONTRAST TECHNIQUE: Multidetector CT imaging of the abdomen and pelvis was performed using the standard protocol following bolus administration of intravenous contrast. Sagittal and coronal MPR images reconstructed from axial data set. CONTRAST:  188mL OMNIPAQUE IOHEXOL 300 MG/ML SOLN IV. No oral contrast. COMPARISON:  None FINDINGS: Lower chest: Bibasilar atelectasis with question additional infiltrate in the RIGHT lower lobe. Hepatobiliary: Mild fatty infiltration of liver. Gallbladder surgically absent. No biliary dilatation. Pancreas: Prior resection of pancreatic tail. Pancreatic head, body and proximal tail normal appearance. No definite pancreatic mass. Spleen: Surgically absent Adrenals/Urinary Tract: Adrenal glands normal appearance. BILATERAL peripelvic renal cysts. No definite renal mass, urinary tract calcification, hydronephrosis or ureteral dilatation. Bladder unremarkable. Stomach/Bowel: Multiple dilated small bowel loops in the LEFT mid abdomen with transition to normal caliber in the anterior LEFT mid abdomen. At the site of transition, small bowel loop appears thickened with adjacent thickening of anterior abdominal wall soft tissue, could reflect scarring/fibrosis is a result of prior surgery or could reflect tumor recurrence at site. Wall thickening of the gastric antrum. Contracted body stomach with normal appearing proximal stomach. Distal small bowel loops and colon decompressed. Few scattered diverticula of sigmoid colon without  diverticulitis. Vascular/Lymphatic: Atherosclerotic calcifications aorta and iliac arteries without aneurysm. Coronary arterial calcification. Tip of central venous catheter at superior RIGHT atrium. Few upper normal sized mesenteric lymph nodes in the RIGHT mid abdomen, nonspecific, 9 mm nodes images 48, 50 and 55. Reproductive: Unremarkable prostate gland Other: Infiltrative changes present in the LEFT upper quadrant under the diaphragm antral laterally, anterior and inferior to the stomach, question omental tumor. Additional tumor nodule infiltrate in the anterior abdominal wall LEFT mid abdomen 3.2 x 2.9 x 2.5 cm with central necrosis image 57. Two additional enhancing nodules in the RIGHT mid anterior abdominal wall measuring 2.1 x 1.9 cm and 1.6 x 1.5 cm question tumor. No free air or free fluid. Postsurgical changes of the anterior abdominal wall. Small ventral hernia in epigastrium containing a nondilated small bowel loop. Musculoskeletal: Rotary scoliosis and multilevel degenerative changes of the thoracolumbar spine. IMPRESSION: Small-bowel obstruction with dilated proximal and decompressed distal small bowel loops, due to obstruction at the anterior LEFT mid abdomen where soft tissue thickening is identified; this is most likely due to tumor at the LEFT upper quadrant anteriorly. Tumor nodule at the LEFT mid abdomen anterior abdominal wall 2.9 x 3.2 cm. Additional soft tissue infiltrative change in the LEFT upper quadrant anterolateral and inferior to the stomach is suspicious for omental tumor/caking. Nonspecific upper normal sized RIGHT mid abdominal mesenteric lymph nodes. Thickened wall of gastric antrum suspicious for tumor though this could also be seen  with gastritis, infiltrative processes and prior radiation. Herniation of a small bowel loop into the epigastrium. Electronically Signed   By: Lavonia Dana M.D.   On: 03/04/2018 12:33   Dg Abd Portable 1 View  Result Date: 03/04/2018 CLINICAL  DATA:  Nasal gastric tube placement EXAM: PORTABLE ABDOMEN - 1 VIEW COMPARISON:  CT abdomen and pelvis March 04, 2018 FINDINGS: Nasogastric tube tip and side port are in the stomach. Postoperative changes are noted in the upper abdomen. There are loops of mildly dilated small bowel, less pronounced than on recent CT. No free air evident. Lung bases are clear. Contrast is seen in the urinary bladder. There is arthropathy in the lumbar spine with lumbar dextroscoliosis. IMPRESSION: Nasogastric tube tip and side port in stomach. Less bowel dilatation compared to earlier in the day. A degree of bowel obstruction likely persists, however. No free air. Lung bases clear. Electronically Signed   By: Lowella Grip III M.D.   On: 03/04/2018 15:04     Scheduled Meds: . enoxaparin (LOVENOX) injection  30 mg Subcutaneous Q24H  . insulin aspart  0-15 Units Subcutaneous Q4H  . levothyroxine  25 mcg Intravenous Daily  . pantoprazole (PROTONIX) IV  40 mg Intravenous Daily   Continuous Infusions: . sodium chloride 125 mL/hr at 03/05/18 0837    Assessment & Plan:    1.  Small bowel obstruction: secondary to adhesions/malignancy.  Appreciate general surgery evaluation and input.  NG tube to be clamped today and start on clear liquid diet.  Continue IV hydration.  Medical management for now, according to daughter patient would not want surgical interventions.  2.  Hyperlipidemia: Oral medications on hold.  Lipid profile in a.m., given recent weight loss may not need this medication.  3.  Hypothyroidism: On IV Synthroid  4.  Advanced pancreatic cancer: Discussed with daughter Dierdre Highman ([phone]) who stated that family is well aware of the advanced nature of pancreatic cancer but would like to keep full CODE STATUS for now.  She also reported that patient clearly stated to them that he would not want any operative surgical interventions.  If patient were to fail diet tolerance, then family will  consider palliative care discussions and revision of CODE STATUS.    DVT prophylaxis: Lovenox Code Status: Full code as described above Family Communication: Discussed with daughter who is English-speaking in presence of wife Disposition Plan: Home     LOS: 1 day    Time spent: 45 minutes    Guilford Shi, MD [facility] Pager [phone]  If 7PM-7AM, please contact night-coverage [email or web address] Password [email or web address] 03/05/2018, 11:40 AM

## 2018-03-06 LAB — GLUCOSE, CAPILLARY
Glucose-Capillary: 118 mg/dL — ABNORMAL HIGH (ref 70–99)
Glucose-Capillary: 144 mg/dL — ABNORMAL HIGH (ref 70–99)
Glucose-Capillary: 75 mg/dL (ref 70–99)
Glucose-Capillary: 88 mg/dL (ref 70–99)
Glucose-Capillary: 88 mg/dL (ref 70–99)
Glucose-Capillary: 92 mg/dL (ref 70–99)

## 2018-03-06 LAB — CANCER ANTIGEN 19-9: CA 19-9: 466 U/mL — ABNORMAL HIGH (ref 0–35)

## 2018-03-06 LAB — CA 125: Cancer Antigen (CA) 125: 94.5 U/mL

## 2018-03-06 MED ORDER — BOOST / RESOURCE BREEZE PO LIQD CUSTOM
1.0000 | Freq: Two times a day (BID) | ORAL | Status: DC
  Administered 2018-03-06 – 2018-03-07 (×3): 1 via ORAL

## 2018-03-06 MED ORDER — ADULT MULTIVITAMIN W/MINERALS CH
1.0000 | ORAL_TABLET | Freq: Every day | ORAL | Status: DC
  Administered 2018-03-06 – 2018-03-07 (×2): 1 via ORAL
  Filled 2018-03-06 (×3): qty 1

## 2018-03-06 NOTE — Care Management Important Message (Signed)
Important Message  Patient Details  Name: Tony Austin MRN: [id number] Date of Birth: [birth year]   Medicare Important Message Given:  Yes    Yoshiko Keleher 03/06/2018, 3:45 PM

## 2018-03-06 NOTE — Progress Notes (Signed)
Initial Nutrition Assessment  DOCUMENTATION CODES:   Non-severe (moderate) malnutrition in context of chronic illness  INTERVENTION:   -Continue Boost Breeze po TID, each supplement provides 250 kcal and 9 grams of protein -MVI with minerals daily -RD will follow for diet advancement and supplement as appropriate  NUTRITION DIAGNOSIS:   Moderate Malnutrition related to chronic illness(metastatic pancreatic cancer) as evidenced by mild fat depletion, moderate fat depletion, mild muscle depletion, moderate muscle depletion.  GOAL:   Patient will meet greater than or equal to 90% of their needs  MONITOR:   Supplement acceptance, PO intake, Diet advancement, Labs, Weight trends, Skin, I & O's  REASON FOR ASSESSMENT:   Malnutrition Screening Tool    ASSESSMENT:   Tony Austin is a 84 y.o. male with medical history significant for pancreatic cancer on chemo, hypothyroidism, HLD who presented to the ED today with c/o abdominal pain and vomiting.   Pt admitted with partial SBO, pancreatic cancer with peritoneal mets.   12/11- NGT placed 12/12- NGT clamped 12/13- NGT removed, advanced to clears  Reviewed I/O's: + 2 L x 24 hours and +3.6 L since admission  Case discussed with RN, who reports NGT has been removed and pt has been advanced to a clear liquid diet.   Spoke with pt and wife, who are both in good spirits today. Pt consuming ice pop on lunch tray without difficulty. Observed breakfast tray- pt consumed 100% of clear liquids. Per pt wife, pt had a great appetite PTA- he does not follow a restricted diet and consumes 3 meals per day.   Per pt wife, pt has lost a lot of weight over the past 3 months. She reports pt UBW is around 139#. Per CareEverywhere, wt of 56.25 kg on 09/01/17. Pt has experienced a 6.5% wt loss over the past 6 months, which while not significant for time frame, is concerning given pancreatic cancer. Suspect disease progression contributing to weight loss.    Discussed potential for diet advancement and importance of good meal and supplement intake to promote healing. Pt does not consume supplements at home, but is enjoying the [employer] supplement ordered.   Last Hgb A1c: 5.7 (10/21/08). PTA DM medications are 500 mg metformin BID.   Labs reviewed: CBGS: 88-144 (inpatient orders for glycemic control are 0-15 units insulin aspart every 4 hours).   NUTRITION - FOCUSED PHYSICAL EXAM:    Most Recent Value  Orbital Region  Moderate depletion  Upper Arm Region  Moderate depletion  Thoracic and Lumbar Region  Mild depletion  Buccal Region  Mild depletion  Temple Region  Moderate depletion  Clavicle Bone Region  Moderate depletion  Clavicle and Acromion Bone Region  Moderate depletion  Scapular Bone Region  Moderate depletion  Dorsal Hand  Moderate depletion  Patellar Region  Moderate depletion  Anterior Thigh Region  Moderate depletion  Posterior Calf Region  Moderate depletion  Edema (RD Assessment)  None  Hair  Reviewed  Eyes  Reviewed  Mouth  Reviewed  Skin  Reviewed  Nails  Reviewed       Diet Order:   Diet Order            Diet clear liquid Room service appropriate? Yes; Fluid consistency: Thin  Diet effective now              EDUCATION NEEDS:   Education needs have been addressed  Skin:  Skin Assessment: Reviewed RN Assessment  Last BM:  03/06/18  Height:   Ht Readings  from Last 1 Encounters:  03/04/18 5\' 5"  (1.651 m)    Weight:   Wt Readings from Last 1 Encounters:  03/04/18 52.6 kg    Ideal Body Weight:  61.8 kg  BMI:  Body mass index is 19.3 kg/m.  Estimated Nutritional Needs:   Kcal:  1400-1600  Protein:  65-80 grams  Fluid:  1.4-1.6 L    Nicole Hafley A. Jimmye Norman, RD, LDN, CDE Pager: [phone] After hours Pager: [phone]

## 2018-03-06 NOTE — Plan of Care (Signed)
  Problem: Education: Goal: Knowledge of General Education information will improve Description: Including pain rating scale, medication(s)/side effects and non-pharmacologic comfort measures Outcome: Progressing   Problem: Clinical Measurements: Goal: Ability to maintain clinical measurements within normal limits will improve Outcome: Progressing   Problem: Nutrition: Goal: Adequate nutrition will be maintained Outcome: Progressing   Problem: Elimination: Goal: Will not experience complications related to bowel motility Outcome: Progressing   Problem: Pain Managment: Goal: General experience of comfort will improve Outcome: Progressing   

## 2018-03-06 NOTE — Progress Notes (Signed)
[facility] Progress Note     Subjective: CC-  Resting comfortably this morning.  Confusion between note and orders from yesterday, therefore patient's NG tube has been clamped since yesterday morning. He has been sipping on liquids. Denies any n/v. Continues to have loose BMs and pass flatus. Denies bloating. States that he still has some intermittent abdominal pain, but it feels like his baseline abdominal pain.   Objective: Vital signs in last 24 hours: Temp:  [98.3 F (36.8 C)-98.7 F (37.1 C)] 98.3 F (36.8 C) (12/13 0418) Pulse Rate:  [66-72] 69 (12/13 0418) Resp:  [17-18] 17 (12/13 0418) BP: (130-135)/(68-77) 130/68 (12/13 0418) SpO2:  [96 %-98 %] 97 % (12/13 0418) Last BM Date: 03/05/18  Intake/Output from previous day: 12/12 0701 - 12/13 0700 In: 3236.6 [P.O.:220; I.V.:3016.6] Out: 1200 [Urine:1200] Intake/Output this shift: No intake/output data recorded.  PE: Gen:  Alert, NAD, pleasant HEENT: EOM's intact, pupils equal and round Pulm:  CTAB, no W/R/R, effort normal Abd: Soft, ND, +BS in all 4 quadrants, firm masses/lesions noted in left and right side of abdomen and these areas are tender. No peritonitis  Ext:  Calves soft and nontender Skin: no rashes noted, warm and dry  Lab Results:  Recent Labs    03/04/18 1003 03/05/18 0650  WBC 11.8* 8.2  HGB 9.9* 9.1*  HCT 32.2* 29.9*  PLT 448* 409*   BMET Recent Labs    03/04/18 1003 03/05/18 0650  NA 134* 135  K 3.8 3.5  CL 101 103  CO2 23 23  GLUCOSE 148* 83  BUN 13 10  CREATININE 0.68 0.60*  CALCIUM 9.0 8.4*   PT/INR No results for input(s): LABPROT, INR in the last 72 hours. CMP     Component Value Date/Time   NA 135 03/05/2018 0650   K 3.5 03/05/2018 0650   CL 103 03/05/2018 0650   CO2 23 03/05/2018 0650   GLUCOSE 83 03/05/2018 0650   BUN 10 03/05/2018 0650   CREATININE 0.60 (L) 03/05/2018 0650   CALCIUM 8.4 (L) 03/05/2018 0650   PROT 5.6 (L) 03/05/2018 0650   ALBUMIN 2.6  (L) 03/05/2018 0650   AST 25 03/05/2018 0650   ALT 27 03/05/2018 0650   ALKPHOS 90 03/05/2018 0650   BILITOT 0.6 03/05/2018 0650   GFRNONAA >60 03/05/2018 0650   GFRAA >60 03/05/2018 0650   Lipase     Component Value Date/Time   LIPASE 21 03/04/2018 1003       Studies/Results: Ct Abdomen Pelvis W Contrast  Result Date: 03/04/2018 CLINICAL DATA:  Abdominal pain, nausea, vomiting, history of pancreatic cancer, diabetes mellitus, smoking EXAM: CT ABDOMEN AND PELVIS WITH CONTRAST TECHNIQUE: Multidetector CT imaging of the abdomen and pelvis was performed using the standard protocol following bolus administration of intravenous contrast. Sagittal and coronal MPR images reconstructed from axial data set. CONTRAST:  127mL OMNIPAQUE IOHEXOL 300 MG/ML SOLN IV. No oral contrast. COMPARISON:  None FINDINGS: Lower chest: Bibasilar atelectasis with question additional infiltrate in the RIGHT lower lobe. Hepatobiliary: Mild fatty infiltration of liver. Gallbladder surgically absent. No biliary dilatation. Pancreas: Prior resection of pancreatic tail. Pancreatic head, body and proximal tail normal appearance. No definite pancreatic mass. Spleen: Surgically absent Adrenals/Urinary Tract: Adrenal glands normal appearance. BILATERAL peripelvic renal cysts. No definite renal mass, urinary tract calcification, hydronephrosis or ureteral dilatation. Bladder unremarkable. Stomach/Bowel: Multiple dilated small bowel loops in the LEFT mid abdomen with transition to normal caliber in the anterior LEFT mid abdomen. At the site of  transition, small bowel loop appears thickened with adjacent thickening of anterior abdominal wall soft tissue, could reflect scarring/fibrosis is a result of prior surgery or could reflect tumor recurrence at site. Wall thickening of the gastric antrum. Contracted body stomach with normal appearing proximal stomach. Distal small bowel loops and colon decompressed. Few scattered diverticula of  sigmoid colon without diverticulitis. Vascular/Lymphatic: Atherosclerotic calcifications aorta and iliac arteries without aneurysm. Coronary arterial calcification. Tip of central venous catheter at superior RIGHT atrium. Few upper normal sized mesenteric lymph nodes in the RIGHT mid abdomen, nonspecific, 9 mm nodes images 48, 50 and 55. Reproductive: Unremarkable prostate gland Other: Infiltrative changes present in the LEFT upper quadrant under the diaphragm antral laterally, anterior and inferior to the stomach, question omental tumor. Additional tumor nodule infiltrate in the anterior abdominal wall LEFT mid abdomen 3.2 x 2.9 x 2.5 cm with central necrosis image 57. Two additional enhancing nodules in the RIGHT mid anterior abdominal wall measuring 2.1 x 1.9 cm and 1.6 x 1.5 cm question tumor. No free air or free fluid. Postsurgical changes of the anterior abdominal wall. Small ventral hernia in epigastrium containing a nondilated small bowel loop. Musculoskeletal: Rotary scoliosis and multilevel degenerative changes of the thoracolumbar spine. IMPRESSION: Small-bowel obstruction with dilated proximal and decompressed distal small bowel loops, due to obstruction at the anterior LEFT mid abdomen where soft tissue thickening is identified; this is most likely due to tumor at the LEFT upper quadrant anteriorly. Tumor nodule at the LEFT mid abdomen anterior abdominal wall 2.9 x 3.2 cm. Additional soft tissue infiltrative change in the LEFT upper quadrant anterolateral and inferior to the stomach is suspicious for omental tumor/caking. Nonspecific upper normal sized RIGHT mid abdominal mesenteric lymph nodes. Thickened wall of gastric antrum suspicious for tumor though this could also be seen with gastritis, infiltrative processes and prior radiation. Herniation of a small bowel loop into the epigastrium. Electronically Signed   By: Lavonia Dana M.D.   On: 03/04/2018 12:33   Dg Abd Portable 1 View  Result Date:  03/04/2018 CLINICAL DATA:  Nasal gastric tube placement EXAM: PORTABLE ABDOMEN - 1 VIEW COMPARISON:  CT abdomen and pelvis March 04, 2018 FINDINGS: Nasogastric tube tip and side port are in the stomach. Postoperative changes are noted in the upper abdomen. There are loops of mildly dilated small bowel, less pronounced than on recent CT. No free air evident. Lung bases are clear. Contrast is seen in the urinary bladder. There is arthropathy in the lumbar spine with lumbar dextroscoliosis. IMPRESSION: Nasogastric tube tip and side port in stomach. Less bowel dilatation compared to earlier in the day. A degree of bowel obstruction likely persists, however. No free air. Lung bases clear. Electronically Signed   By: Lowella Grip III M.D.   On: 03/04/2018 15:04    Anti-infectives: Anti-infectives (From admission, onward)   None       Assessment/Plan Pancreatic cancer with peritoneal mets - pt lives in [place] - having flatus and BM's - reports abdominal pain as baseline, no n/v  FEN: NGT clamp, CLD, Boost breeze VTE: SCD's, lovenox ID: none Foley: none Follow up: TBD  DISPO: Keep NG tube clamped and allow clear liquids, add Boost Breeze. Ambulate.   LOS: 2 days    Wellington Hampshire , [facility] 03/06/2018, 8:17 AM Pager: [phone] Mon 7:00 am -11:30 AM Tues-Fri 7:00 am-4:30 pm Sat-Sun 7:00 am-11:30 am

## 2018-03-06 NOTE — Progress Notes (Signed)
PROGRESS NOTE    Tony Austin  VEL:[id number] DOB: [birth year] DOA: 03/04/2018 PCP: Out of system/in [place]   Brief Narrative:   84 year old Spanish-speaking male with history of advanced pancreatic cancer who follows oncology, Dr. Nechama Guard ([id number]) in [place] and is on palliative chemotherapy (5-fluorouracil,Onyvide) and visiting his daughter for the holidays presented with complaints of abdominal pain and CT suggested small bowel obstruction due to adhesions/tumor.  CT also reported thickened wall of gastric antrum suspicious for tumor infiltration versus gastritis versus radiation changes as well as extrapancreatic tumor masses including abdominal wall and omental caking.  Patient admitted with NG tube, IV fluids and general surgery consultation.  Subjective:   Patient reports a small bowel movement with normal-appearing stool this morning and also reports passing gas.  States abdominal pain resolved and tolerating clear liquid diet.  NG tube discontinued per general surgery.  Objective: Vitals:   03/05/18 0527 03/05/18 1316 03/05/18 2117 03/06/18 0418  BP: 106/61 131/69 135/77 130/68  Pulse: 68 72 66 69  Resp: 14 18 17 17   Temp: 98.3 F (36.8 C) 98.7 F (37.1 C) 98.3 F (36.8 C) 98.3 F (36.8 C)  TempSrc: Oral Oral Oral Oral  SpO2: 97% 98% 96% 97%  Weight:      Height:        Intake/Output Summary (Last 24 hours) at 03/06/2018 1544 Last data filed at 03/06/2018 1400 Gross per 24 hour  Intake 2817.63 ml  Output 2535 ml  Net 282.63 ml   Filed Weights   03/04/18 0938  Weight: 52.6 kg    Physical Examination:  General exam: Appears calm and comfortable  Respiratory system: Clear to auscultation. Respiratory effort normal. Cardiovascular system: S1 & S2 heard, RRR. No JVD, murmurs, rubs, gallops or clicks. No pedal edema. Gastrointestinal system: Abdomen soft and nondistended, mildly tender in periumbilical area.  Palpable upper anterior abdominal wall  masses/old surgical scar and associated incisional hernias. Bowel sounds heard. Central nervous system: Alert and oriented. No focal neurological deficits. Extremities: Symmetric 5 x 5 power. Skin: No rashes, lesions or ulcers Psychiatry: Judgement and insight appear normal. Mood & affect appropriate.     Data Reviewed: I have personally reviewed following labs and imaging studies  CBC: Recent Labs  Lab 03/04/18 1003 03/05/18 0650  WBC 11.8* 8.2  NEUTROABS 9.9*  --   HGB 9.9* 9.1*  HCT 32.2* 29.9*  MCV 85.6 85.4  PLT 448* 235*   Basic Metabolic Panel: Recent Labs  Lab 03/04/18 1003 03/05/18 0650  NA 134* 135  K 3.8 3.5  CL 101 103  CO2 23 23  GLUCOSE 148* 83  BUN 13 10  CREATININE 0.68 0.60*  CALCIUM 9.0 8.4*   GFR: Estimated Creatinine Clearance: 53 mL/min (A) (by C-G formula based on SCr of 0.6 mg/dL (L)). Liver Function Tests: Recent Labs  Lab 03/04/18 1003 03/05/18 0650  AST 41 25  ALT 39 27  ALKPHOS 102 90  BILITOT 0.9 0.6  PROT 6.5 5.6*  ALBUMIN 3.0* 2.6*   Recent Labs  Lab 03/04/18 1003  LIPASE 21   No results for input(s): AMMONIA in the last 168 hours. Coagulation Profile: No results for input(s): INR, PROTIME in the last 168 hours. Cardiac Enzymes: No results for input(s): CKTOTAL, CKMB, CKMBINDEX, TROPONINI in the last 168 hours. BNP (last 3 results) No results for input(s): PROBNP in the last 8760 hours. HbA1C: No results for input(s): HGBA1C in the last 72 hours. CBG: Recent Labs  Lab 03/05/18 2002 03/06/18 0007 03/06/18  0354 03/06/18 0755 03/06/18 1239  GLUCAP 95 92 88 88 118*   Lipid Profile: No results for input(s): CHOL, HDL, LDLCALC, TRIG, CHOLHDL, LDLDIRECT in the last 72 hours. Thyroid Function Tests: No results for input(s): TSH, T4TOTAL, FREET4, T3FREE, THYROIDAB in the last 72 hours. Anemia Panel: No results for input(s): VITAMINB12, FOLATE, FERRITIN, TIBC, IRON, RETICCTPCT in the last 72 hours. Sepsis Labs: No  results for input(s): PROCALCITON, LATICACIDVEN in the last 168 hours.  No results found for this or any previous visit (from the past 240 hour(s)).       Radiology Studies: No results found.   Scheduled Meds: . enoxaparin (LOVENOX) injection  30 mg Subcutaneous Q24H  . feeding supplement  1 Container Oral BID BM  . insulin aspart  0-15 Units Subcutaneous Q4H  . levothyroxine  25 mcg Intravenous Daily  . pantoprazole (PROTONIX) IV  40 mg Intravenous Daily   Continuous Infusions: . sodium chloride 125 mL/hr at 03/06/18 1051    Assessment & Plan:    1.  Small bowel obstruction: secondary to adhesions/malignancy.  Appreciate general surgery evaluation and input.  NG tube discontinued and started on clear liquid diet.  Continue IV hydration for another day.  Medical management for now, according to daughter patient would not want surgical interventions.  I did discuss with patient's primary oncologist Dr. Nechama Guard who stated that patient is known to have diffuse peritoneal metastasis and poor prognosis.  She plans to initiate hospice discussions when patient returns to [facility].  She is aware of current medical management and agrees with nonaggressive interventions.  2.  Hyperlipidemia: Oral medications on hold.  Lipid profile in a.m., given recent weight loss may not need this medication.  3.  Hypothyroidism: On IV Synthroid  4.  Advanced pancreatic cancer: Discussed with daughter Dierdre Highman ([phone]) who stated that family is well aware of the advanced nature of pancreatic cancer but would like to keep full CODE STATUS for now.  She also reported that patient clearly stated to them that he would not want any operative surgical interventions.  If patient were to fail diet tolerance, then family will consider palliative care discussions and revision of CODE STATUS.    DVT prophylaxis: Lovenox Code Status: Full code as described above Family Communication: Discussed with  patient and he appreciated care provided Disposition Plan: Patient states he plans to return to [facility] in couple of weeks.  His plan is to stay here in [place] until after Christmas and visit [place] before returning to his home town.  Patient encouraged to avoid traveling/exertion and to be close to his primary care providers in [facility] as soon as possible.     LOS: 2 days    Time spent: 35 minutes    Guilford Shi, MD [facility] Pager [phone]  If 7PM-7AM, please contact night-coverage [email or web address] Password [facility] 03/06/2018, 3:44 PM

## 2018-03-07 DIAGNOSIS — E440 Moderate protein-calorie malnutrition: Secondary | ICD-10-CM

## 2018-03-07 LAB — LIPID PANEL
CHOL/HDL RATIO: 3.9 ratio
Cholesterol: 128 mg/dL (ref 0–200)
HDL: 33 mg/dL — ABNORMAL LOW (ref >40)
LDL Cholesterol: 76 mg/dL (ref 0–99)
Triglycerides: 94 mg/dL (ref <150)
VLDL: 19 mg/dL (ref 0–40)

## 2018-03-07 LAB — GLUCOSE, CAPILLARY
Glucose-Capillary: 114 mg/dL — ABNORMAL HIGH (ref 70–99)
Glucose-Capillary: 86 mg/dL (ref 70–99)
Glucose-Capillary: 87 mg/dL (ref 70–99)
Glucose-Capillary: 98 mg/dL (ref 70–99)

## 2018-03-07 MED ORDER — LACTULOSE 10 GM/15ML PO SOLN: 20.0000 g | Freq: Every day | ORAL | 0 refills | Status: AC | PRN

## 2018-03-07 MED ORDER — HEPARIN SOD (PORK) LOCK FLUSH 100 UNIT/ML IV SOLN
500.0000 [IU] | INTRAVENOUS | Status: AC | PRN
  Administered 2018-03-07: 500 [IU]

## 2018-03-07 NOTE — Progress Notes (Addendum)
[facility] Progress Note     Subjective: CC:  Rates abd pain 2/10, around his baseline. Having BMs. Denies nausea, vomiting, or distention. Requesting mouthwash.   Objective: Vital signs in last 24 hours: Temp:  [98.2 F (36.8 C)-98.4 F (36.9 C)] 98.4 F (36.9 C) (12/14 0428) Pulse Rate:  [62-68] 62 (12/14 0428) Resp:  [16-18] 18 (12/14 0428) BP: (121-135)/(65-73) 121/65 (12/14 0428) SpO2:  [96 %-98 %] 98 % (12/14 0428) Last BM Date: 03/06/18  Intake/Output from previous day: 12/13 0701 - 12/14 0700 In: 2634.3 [P.O.:900; I.V.:1734.3] Out: 2535 [Urine:2535] Intake/Output this shift: No intake/output data recorded.  PE: Gen:  Alert, NAD, pleasant HEENT: EOM's intact, pupils equal and round Pulm:  CTAB, no W/R/R, effort normal Abd: Soft, ND, +BS in all 4 quadrants,firm masses noted in left and right side of abdomen that are TTP, No peritonitis Ext:  Calves soft and nontender Skin: no rashes noted, warm and dry  Lab Results:  Recent Labs    03/04/18 1003 03/05/18 0650  WBC 11.8* 8.2  HGB 9.9* 9.1*  HCT 32.2* 29.9*  PLT 448* 409*   BMET Recent Labs    03/04/18 1003 03/05/18 0650  NA 134* 135  K 3.8 3.5  CL 101 103  CO2 23 23  GLUCOSE 148* 83  BUN 13 10  CREATININE 0.68 0.60*  CALCIUM 9.0 8.4*   PT/INR No results for input(s): LABPROT, INR in the last 72 hours. CMP     Component Value Date/Time   NA 135 03/05/2018 0650   K 3.5 03/05/2018 0650   CL 103 03/05/2018 0650   CO2 23 03/05/2018 0650   GLUCOSE 83 03/05/2018 0650   BUN 10 03/05/2018 0650   CREATININE 0.60 (L) 03/05/2018 0650   CALCIUM 8.4 (L) 03/05/2018 0650   PROT 5.6 (L) 03/05/2018 0650   ALBUMIN 2.6 (L) 03/05/2018 0650   AST 25 03/05/2018 0650   ALT 27 03/05/2018 0650   ALKPHOS 90 03/05/2018 0650   BILITOT 0.6 03/05/2018 0650   GFRNONAA >60 03/05/2018 0650   GFRAA >60 03/05/2018 0650   Lipase     Component Value Date/Time   LIPASE 21 03/04/2018 1003        Studies/Results: No results found.  Anti-infectives: Anti-infectives (From admission, onward)   None     Assessment/Plan Pancreatic cancer with peritoneal mets- pt lives in [place] -having flatus and BM's - reports abdominal pain as baseline, no n/v - D/C NG 12/13   BMW:UXLKG fulls this morning, protein shakes VTE: SCD's, lovenox MW:NUUV Foley:none Follow up:TBD  DISPO: may advance diet to SOFT as tolerated. If patient develops obstructive sxs on solid food, despite chewing his food very well, he may need to transition to a full liquid diet. Stable for D/C from surgical perspective.     LOS: 3 days    Obie Dredge, [facility] Pager: [phone]

## 2018-03-07 NOTE — Discharge Summary (Signed)
Physician Discharge Summary  Tony Austin BWG:[id number] DOB: [birth year] DOA: 03/04/2018  PCP: System, Pcp Not In  Admit date: 03/04/2018 Discharge date: 03/07/2018 Consultations: General Surgery ; Dr Ninfa Linden Admitted From: home Disposition:  home  Discharge Diagnoses:  Principal Problem:   SBO (small bowel obstruction) ([place]) Active Problems:   Dyslipidemia   Hypothyroidism   Pancreatic adenocarcinoma (HCC)   Malnutrition of moderate degree   Brief/Interim Summary: 84 year old Spanish-speaking male with history of advanced pancreatic cancer who follows oncology, Dr. Nechama Austin ([id number]) in [place] and is on palliative chemotherapy (5-fluorouracil,Onyvide) and visiting his daughter for the holidays presented with complaints of abdominal pain and CT suggested small bowel obstruction due to adhesions/tumor.  CT also reported thickened wall of gastric antrum suspicious for tumor infiltration versus gastritis versus radiation changes as well as extrapancreatic tumor masses including abdominal wall and omental caking.  Patient admitted with NG tube, IV fluids and general surgery consultation.  1.  Small bowel obstruction: secondary to adhesions vs malignancy.  Appreciate general surgery evaluation and input.Patient was managed conservatively and his condition improved.  NG tube discontinued on 12/13 and started on clear liquid diet. Patient does not want any surgical interventions.  I did discuss with patient's primary oncologist Dr. Nechama Austin who stated that patient is known to have diffuse peritoneal metastasis and poor prognosis.  She plans to initiate hospice discussions when patient returns to [facility].  She is aware of current medical management and agrees with nonaggressive interventions.Patient seen for follow up for General surgery and cleared for discharge on full liquid/soft well chewed diet as tolerated.   2.Advanced pancreatic cancer: Discussed with daughter Tony Austin  ([id number]) who stated that family is well aware of the advanced nature of pancreatic cancer but would like to keep full CODE STATUS for now.  She also reported that patient clearly stated to them that he would not want any operative surgical interventions.  If patient were to fail diet tolerance, then family will consider palliative care discussions and revision of CODE STATUS.    3.  Hypothyroidism: Resume Synthroid  4.  Hyperlipidemia: Given weight loss and terminal state, none prescribed  5. ?Diabetes Mellitus: Patient noted to be on metformin at home. Discontinued this medication as BG in 80s here on non diabetic diet and patient been loosing weight in setting of malignancy.   Disposition Plan: Patient states he plans to return to [facility] in couple of weeks.  His plan is to stay here in [place] until after Christmas and visit [place] before returning to his home town.  Patient encouraged to avoid traveling/exertion and to be close to his primary care providers in [facility] as soon as possible   Discharge Exam: Vitals:   03/06/18 2219 03/07/18 0428  BP: 127/73 121/65  Pulse: 68 62  Resp: 18 18  Temp: 98.4 F (36.9 C) 98.4 F (36.9 C)  SpO2: 96% 98%   Vitals:   03/06/18 0418 03/06/18 1609 03/06/18 2219 03/07/18 0428  BP: 130/68 135/69 127/73 121/65  Pulse: 69 66 68 62  Resp: 17 16 18 18   Temp: 98.3 F (36.8 C) 98.2 F (36.8 C) 98.4 F (36.9 C) 98.4 F (36.9 C)  TempSrc: Oral Oral Oral Oral  SpO2: 97% 97% 96% 98%  Weight:      Height:        General: Pt is alert, awake, not in acute distress Cardiovascular: RRR, S1/S2 +, no rubs, no gallops Respiratory: CTA bilaterally, no wheezing, no rhonchi Abdominal: Soft,  NT, ND, bowel sounds +. Palpable abdominal wall masses and old midline sx scar with incisional hernia. Extremities: no edema, no cyanosis  Discharge Instructions  Discharge Instructions    Call MD for:  difficulty breathing, headache or visual  disturbances   Complete by:  As directed    Call MD for:  extreme fatigue   Complete by:  As directed    Call MD for:  persistant nausea and vomiting   Complete by:  As directed    Call MD for:  severe uncontrolled pain   Complete by:  As directed    Call MD for:  temperature >100.4   Complete by:  As directed    Diet - low sodium heart healthy   Complete by:  As directed    Full liquid /pureed diet/diabetic   Increase activity slowly   Complete by:  As directed      Allergies as of 03/07/2018   No Known Allergies     Medication List    STOP taking these medications   metFORMIN 500 MG tablet Commonly known as:  GLUCOPHAGE     TAKE these medications   azelastine 0.1 % nasal spray Commonly known as:  ASTELIN Place 2 sprays into both nostrils as needed.   diclofenac sodium 1 % Gel Commonly known as:  VOLTAREN Apply 1 application topically as needed.   finasteride 5 MG tablet Commonly known as:  PROSCAR Take 5 mg by mouth at bedtime.   HYDROcodone-acetaminophen 5-325 MG tablet Commonly known as:  NORCO/VICODIN Take 1 tablet by mouth every 6 (six) hours as needed for moderate pain or severe pain.   lactulose 10 GM/15ML solution Commonly known as:  CHRONULAC Take 30 mLs (20 g total) by mouth daily as needed for moderate constipation.   levothyroxine 50 MCG tablet Commonly known as:  SYNTHROID, LEVOTHROID Take 50 mcg by mouth daily.   MYRBETRIQ 25 MG Tb24 tablet Generic drug:  mirabegron ER Take 25 mg by mouth as needed (Bladder).   ondansetron 4 MG tablet Commonly known as:  ZOFRAN Take 4 mg by mouth as needed.   pantoprazole 40 MG tablet Commonly known as:  PROTONIX Take 40 mg by mouth at bedtime.   tamsulosin 0.4 MG Caps capsule Commonly known as:  FLOMAX Take 0.4 mg by mouth every evening.   TYLENOL 8 HOUR ARTHRITIS PAIN 650 MG CR tablet Generic drug:  acetaminophen Take 650 mg by mouth every 8 (eight) hours as needed for pain.   vitamin B-12 500  MCG tablet Commonly known as:  CYANOCOBALAMIN Take 500 mcg by mouth daily.   Vitamin D3 50 MCG (2000 UT) capsule Take 2,000 Units by mouth daily.       No Known Allergies     Discharge Condition: Stable CODE STATUS: Full code Diet recommendation: Full liquid/pureed diabetic diet Recommendations for Outpatient Follow-up:  1. Follow up with PCP and primary Oncologist Dr Tony Austin in 1-2 weeks 2. Please obtain BMP/CBC in one week       The results of significant diagnostics from this hospitalization (including imaging, microbiology, ancillary and laboratory) are listed below for reference.     Microbiology: No results found for this or any previous visit (from the past 240 hour(s)).   Labs: BNP (last 3 results) No results for input(s): BNP in the last 8760 hours. Basic Metabolic Panel: Recent Labs  Lab 03/04/18 1003 03/05/18 0650  NA 134* 135  K 3.8 3.5  CL 101 103  CO2 23 23  GLUCOSE 148* 83  BUN 13 10  CREATININE 0.68 0.60*  CALCIUM 9.0 8.4*   Liver Function Tests: Recent Labs  Lab 03/04/18 1003 03/05/18 0650  AST 41 25  ALT 39 27  ALKPHOS 102 90  BILITOT 0.9 0.6  PROT 6.5 5.6*  ALBUMIN 3.0* 2.6*   Recent Labs  Lab 03/04/18 1003  LIPASE 21   No results for input(s): AMMONIA in the last 168 hours. CBC: Recent Labs  Lab 03/04/18 1003 03/05/18 0650  WBC 11.8* 8.2  NEUTROABS 9.9*  --   HGB 9.9* 9.1*  HCT 32.2* 29.9*  MCV 85.6 85.4  PLT 448* 409*   Cardiac Enzymes: No results for input(s): CKTOTAL, CKMB, CKMBINDEX, TROPONINI in the last 168 hours. BNP: Invalid input(s): POCBNP CBG: Recent Labs  Lab 03/06/18 1602 03/06/18 2006 03/06/18 2359 03/07/18 0430 03/07/18 0744  GLUCAP 144* 75 87 86 114*   D-Dimer No results for input(s): DDIMER in the last 72 hours. Hgb A1c No results for input(s): HGBA1C in the last 72 hours. Lipid Profile No results for input(s): CHOL, HDL, LDLCALC, TRIG, CHOLHDL, LDLDIRECT in the last 72  hours. Thyroid function studies No results for input(s): TSH, T4TOTAL, T3FREE, THYROIDAB in the last 72 hours.  Invalid input(s): FREET3 Anemia work up No results for input(s): VITAMINB12, FOLATE, FERRITIN, TIBC, IRON, RETICCTPCT in the last 72 hours. Urinalysis    Component Value Date/Time   COLORURINE YELLOW 03/04/2018 [place] 03/04/2018 0937   LABSPEC 1.032 (H) 03/04/2018 0937   PHURINE 7.0 03/04/2018 [place] 03/04/2018 0937   HGBUR NEGATIVE 03/04/2018 0937   BILIRUBINUR NEGATIVE 03/04/2018 [place] 03/04/2018 0937   PROTEINUR NEGATIVE 03/04/2018 0937   NITRITE NEGATIVE 03/04/2018 0937   LEUKOCYTESUR NEGATIVE 03/04/2018 0937   Sepsis Labs Invalid input(s): PROCALCITONIN,  WBC,  LACTICIDVEN Microbiology No results found for this or any previous visit (from the past 240 hour(s)).  Procedures/Studies: Ct Abdomen Pelvis W Contrast  Result Date: 03/04/2018 CLINICAL DATA:  Abdominal pain, nausea, vomiting, history of pancreatic cancer, diabetes mellitus, smoking EXAM: CT ABDOMEN AND PELVIS WITH CONTRAST TECHNIQUE: Multidetector CT imaging of the abdomen and pelvis was performed using the standard protocol following bolus administration of intravenous contrast. Sagittal and coronal MPR images reconstructed from axial data set. CONTRAST:  152mL OMNIPAQUE IOHEXOL 300 MG/ML SOLN IV. No oral contrast. COMPARISON:  None FINDINGS: Lower chest: Bibasilar atelectasis with question additional infiltrate in the RIGHT lower lobe. Hepatobiliary: Mild fatty infiltration of liver. Gallbladder surgically absent. No biliary dilatation. Pancreas: Prior resection of pancreatic tail. Pancreatic head, body and proximal tail normal appearance. No definite pancreatic mass. Spleen: Surgically absent Adrenals/Urinary Tract: Adrenal glands normal appearance. BILATERAL peripelvic renal cysts. No definite renal mass, urinary tract calcification, hydronephrosis or  ureteral dilatation. Bladder unremarkable. Stomach/Bowel: Multiple dilated small bowel loops in the LEFT mid abdomen with transition to normal caliber in the anterior LEFT mid abdomen. At the site of transition, small bowel loop appears thickened with adjacent thickening of anterior abdominal wall soft tissue, could reflect scarring/fibrosis is a result of prior surgery or could reflect tumor recurrence at site. Wall thickening of the gastric antrum. Contracted body stomach with normal appearing proximal stomach. Distal small bowel loops and colon decompressed. Few scattered diverticula of sigmoid colon without diverticulitis. Vascular/Lymphatic: Atherosclerotic calcifications aorta and iliac arteries without aneurysm. Coronary arterial calcification. Tip of central venous catheter at superior RIGHT atrium. Few upper normal sized mesenteric lymph nodes in the RIGHT  mid abdomen, nonspecific, 9 mm nodes images 48, 50 and 55. Reproductive: Unremarkable prostate gland Other: Infiltrative changes present in the LEFT upper quadrant under the diaphragm antral laterally, anterior and inferior to the stomach, question omental tumor. Additional tumor nodule infiltrate in the anterior abdominal wall LEFT mid abdomen 3.2 x 2.9 x 2.5 cm with central necrosis image 57. Two additional enhancing nodules in the RIGHT mid anterior abdominal wall measuring 2.1 x 1.9 cm and 1.6 x 1.5 cm question tumor. No free air or free fluid. Postsurgical changes of the anterior abdominal wall. Small ventral hernia in epigastrium containing a nondilated small bowel loop. Musculoskeletal: Rotary scoliosis and multilevel degenerative changes of the thoracolumbar spine. IMPRESSION: Small-bowel obstruction with dilated proximal and decompressed distal small bowel loops, due to obstruction at the anterior LEFT mid abdomen where soft tissue thickening is identified; this is most likely due to tumor at the LEFT upper quadrant anteriorly. Tumor nodule at  the LEFT mid abdomen anterior abdominal wall 2.9 x 3.2 cm. Additional soft tissue infiltrative change in the LEFT upper quadrant anterolateral and inferior to the stomach is suspicious for omental tumor/caking. Nonspecific upper normal sized RIGHT mid abdominal mesenteric lymph nodes. Thickened wall of gastric antrum suspicious for tumor though this could also be seen with gastritis, infiltrative processes and prior radiation. Herniation of a small bowel loop into the epigastrium. Electronically Signed   By: Lavonia Dana M.D.   On: 03/04/2018 12:33   Dg Abd Portable 1 View  Result Date: 03/04/2018 CLINICAL DATA:  Nasal gastric tube placement EXAM: PORTABLE ABDOMEN - 1 VIEW COMPARISON:  CT abdomen and pelvis March 04, 2018 FINDINGS: Nasogastric tube tip and side port are in the stomach. Postoperative changes are noted in the upper abdomen. There are loops of mildly dilated small bowel, less pronounced than on recent CT. No free air evident. Lung bases are clear. Contrast is seen in the urinary bladder. There is arthropathy in the lumbar spine with lumbar dextroscoliosis. IMPRESSION: Nasogastric tube tip and side port in stomach. Less bowel dilatation compared to earlier in the day. A degree of bowel obstruction likely persists, however. No free air. Lung bases clear. Electronically Signed   By: Lowella Grip III M.D.   On: 03/04/2018 15:04    (Echo, Carotid, EGD, Colonoscopy, ERCP)  Time coordinating discharge: Over 30 minutes  SIGNED:   Guilford Shi, MD  [facility] 03/07/2018, 9:12 AM Pager   If 7PM-7AM, please contact night-coverage [email or web address] Password [email or web address]

## 2019-11-02 IMAGING — CT CT ABD-PELV W/ CM
1 series · 1 of 1 positions shown · IV contrast (omnipaque)
Comparison: None

CLINICAL DATA: Abdominal pain, nausea, vomiting, history of
pancreatic cancer, diabetes mellitus, smoking

EXAM:
CT ABDOMEN AND PELVIS WITH CONTRAST
TECHNIQUE: Multidetector CT imaging of the abdomen and pelvis was performed
using the standard protocol following bolus administration of
intravenous contrast. Sagittal and coronal MPR images reconstructed
from axial data set.
CONTRAST:  100mL OMNIPAQUE IOHEXOL 300 MG/ML SOLN IV. No oral
contrast.

[Series 1: topogram 0.6 t20f · sagittal · 1.00mm/px · 1 of 1 slices shown]
[im 1/1]
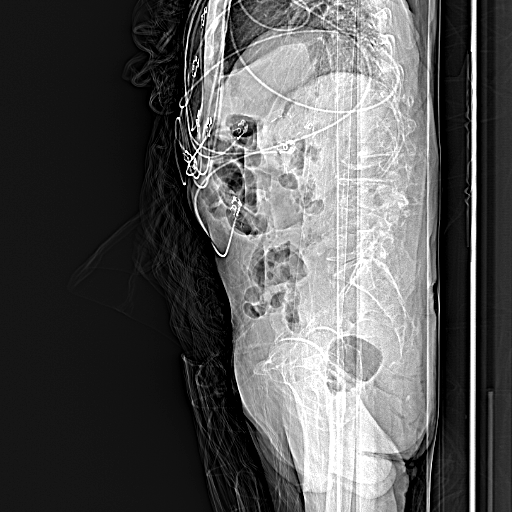

[1 of 1 positions shown; findings below may reference images not displayed]

FINDINGS: Lower chest: Bibasilar atelectasis with question additional
infiltrate in the RIGHT lower lobe.

Hepatobiliary: Mild fatty infiltration of liver. Gallbladder
surgically absent. No biliary dilatation.

Pancreas: Prior resection of pancreatic tail. Pancreatic head, body
and proximal tail normal appearance. No definite pancreatic mass.

Spleen: Surgically absent

Adrenals/Urinary Tract: Adrenal glands normal appearance. BILATERAL
peripelvic renal cysts. No definite renal mass, urinary tract
calcification, hydronephrosis or ureteral dilatation. Bladder
unremarkable.

Stomach/Bowel: Multiple dilated small bowel loops in the LEFT mid
abdomen with transition to normal caliber in the anterior LEFT mid
abdomen. At the site of transition, small bowel loop appears
thickened with adjacent thickening of anterior abdominal wall soft
tissue, could reflect scarring/fibrosis is a result of prior surgery
or could reflect tumor recurrence at site. Wall thickening of the
gastric antrum. Contracted body stomach with normal appearing
proximal stomach. Distal small bowel loops and colon decompressed.
Few scattered diverticula of sigmoid colon without diverticulitis.

Vascular/Lymphatic: Atherosclerotic calcifications aorta and iliac
arteries without aneurysm. Coronary arterial calcification. Tip of
central venous catheter at superior RIGHT atrium. Few upper normal
sized mesenteric lymph nodes in the RIGHT mid abdomen, nonspecific,
9 mm nodes images 48, 50 and 55.

Reproductive: Unremarkable prostate gland

Other: Infiltrative changes present in the LEFT upper quadrant under
the diaphragm antral laterally, anterior and inferior to the
stomach, question omental tumor. Additional tumor nodule infiltrate
in the anterior abdominal wall LEFT mid abdomen [DATE] x 2.9 x 2.5 cm
with central necrosis image 57. Two additional enhancing nodules in
the RIGHT mid anterior abdominal wall measuring 2.1 x 1.9 cm and
x 1.5 cm question tumor. No free air or free fluid. Postsurgical
changes of the anterior abdominal wall. Small ventral hernia in
epigastrium containing a nondilated small bowel loop.

Musculoskeletal: Rotary scoliosis and multilevel degenerative
changes of the thoracolumbar spine.
IMPRESSION: Small-bowel obstruction with dilated proximal and decompressed
distal small bowel loops, due to obstruction at the anterior LEFT
mid abdomen where soft tissue thickening is identified; this is most
likely due to tumor at the LEFT upper quadrant anteriorly.

Tumor nodule at the LEFT mid abdomen anterior abdominal wall 2.9 x
3.2 cm.

Additional soft tissue infiltrative change in the LEFT upper
quadrant anterolateral and inferior to the stomach is suspicious for
omental tumor/caking.

Nonspecific upper normal sized RIGHT mid abdominal mesenteric lymph
nodes.

Thickened wall of gastric antrum suspicious for tumor though this
could also be seen with gastritis, infiltrative processes and prior
radiation.

Herniation of a small bowel loop into the epigastrium.
